# Patient Record
Sex: Female | Born: 1985 | Race: White | Hispanic: No | Marital: Married | Smoking: Never smoker
Health system: Southern US, Academic
[De-identification: ages and names within clinical notes are randomized; demographics above are authoritative.]

## PROBLEM LIST (undated history)

## (undated) DIAGNOSIS — R001 Bradycardia, unspecified: Secondary | ICD-10-CM

## (undated) DIAGNOSIS — G43909 Migraine, unspecified, not intractable, without status migrainosus: Secondary | ICD-10-CM

## (undated) DIAGNOSIS — F419 Anxiety disorder, unspecified: Secondary | ICD-10-CM

## (undated) DIAGNOSIS — N809 Endometriosis, unspecified: Secondary | ICD-10-CM

## (undated) HISTORY — DX: Bradycardia, unspecified: R00.1

## (undated) HISTORY — DX: Endometriosis, unspecified: N80.9

## (undated) HISTORY — PX: HX BREAST REDUCTION: SHX8

## (undated) HISTORY — PX: HX DILATION AND CURETTAGE: SHX78

## (undated) HISTORY — PX: HX BREAST BIOPSY: SHX20

## (undated) HISTORY — PX: ABDOMINAL SURGERY: SHX537

## (undated) HISTORY — DX: Migraine, unspecified, not intractable, without status migrainosus: G43.909

## (undated) HISTORY — PX: HX BREAST RECONSTRUCTION: SHX9

## (undated) HISTORY — DX: Anxiety disorder, unspecified: F41.9

## (undated) HISTORY — PX: HX BREAST AUGMENTATION: SHX7

---
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
## 2013-04-22 ENCOUNTER — Ambulatory Visit (INDEPENDENT_AMBULATORY_CARE_PROVIDER_SITE_OTHER): Payer: Self-pay | Admitting: WVUPC-REPRODUCTIVE ENDO

## 2020-12-22 IMAGING — CR XRAY SACRUM/COCCYX MIN 2 VIEWS
1 series · 3 of 3 positions shown · non-contrast
Comparison: Lumbosacral spine x-ray of 03/03/2020.

﻿EXAM:  04445      XRAY SACRUM/COCCYX MIN 2 VIEWS
INDICATION: Trauma due to fall.

[Series 1: view not recorded · 0.17mm/px · 3 of 3 slices shown]
[im 1/3]
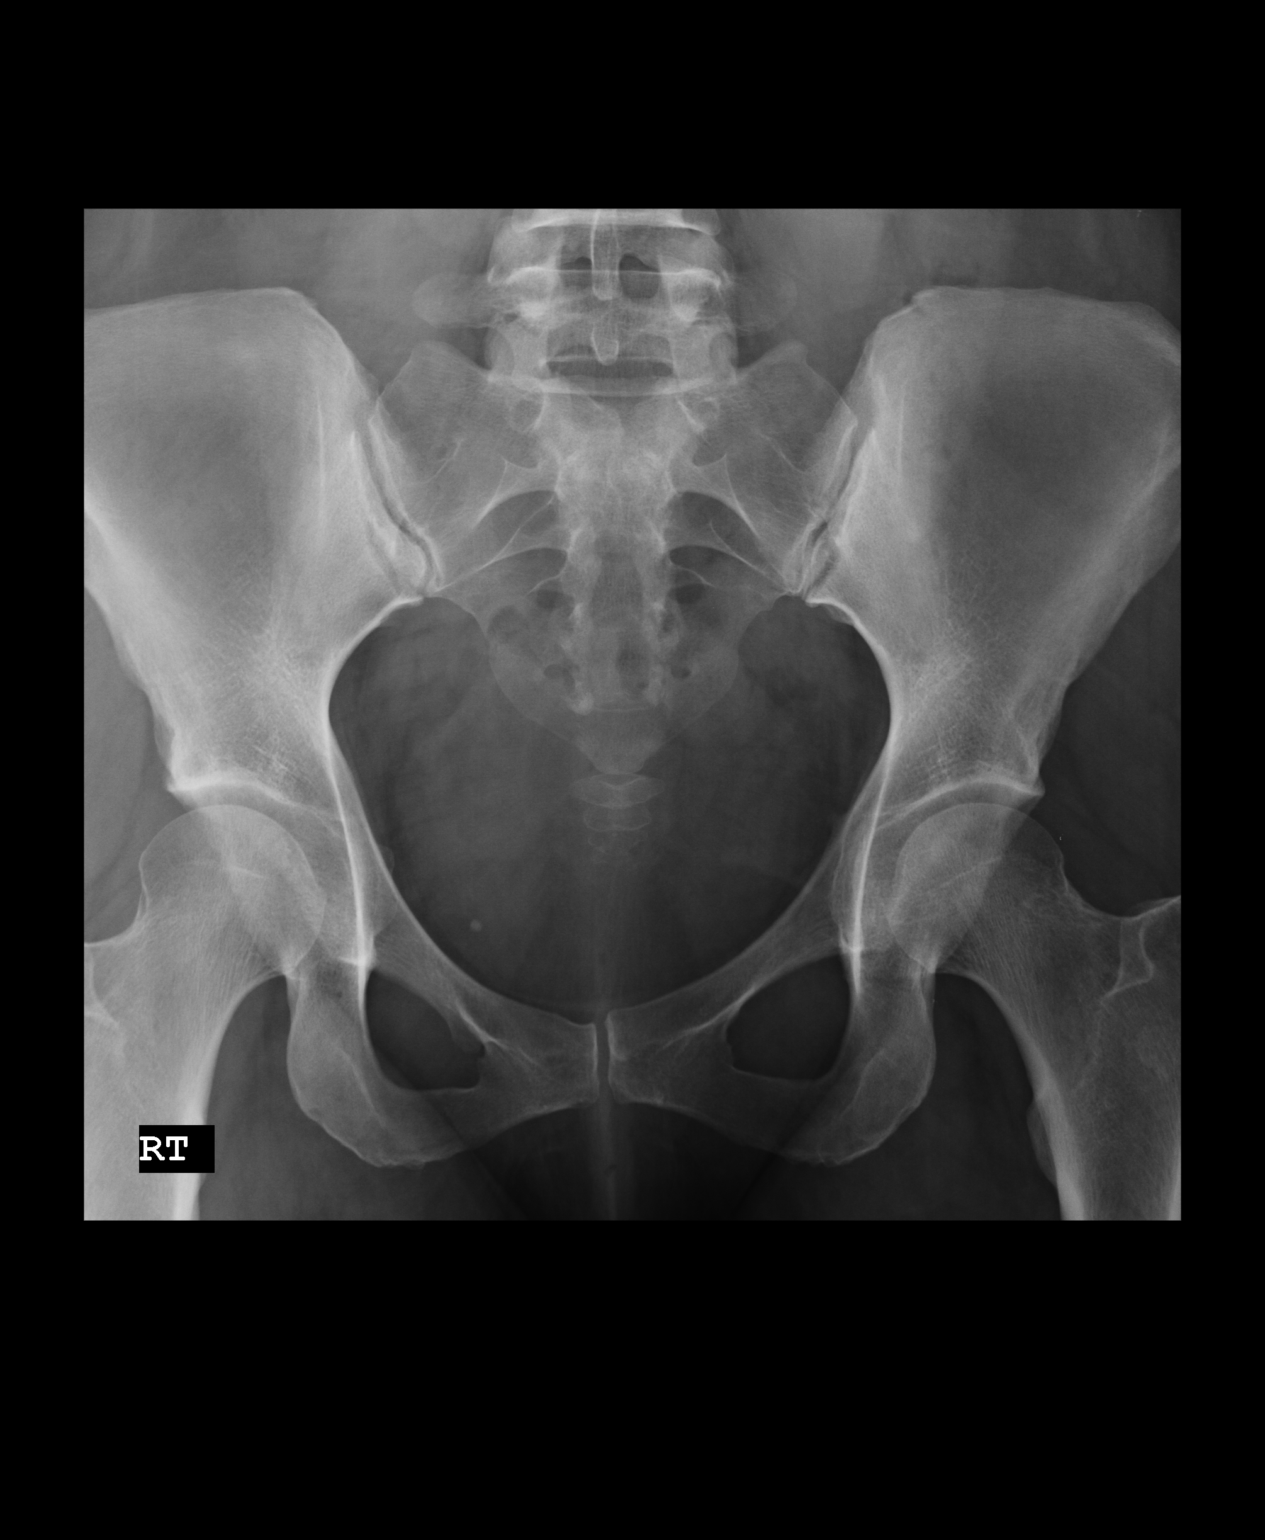
[im 2/3]
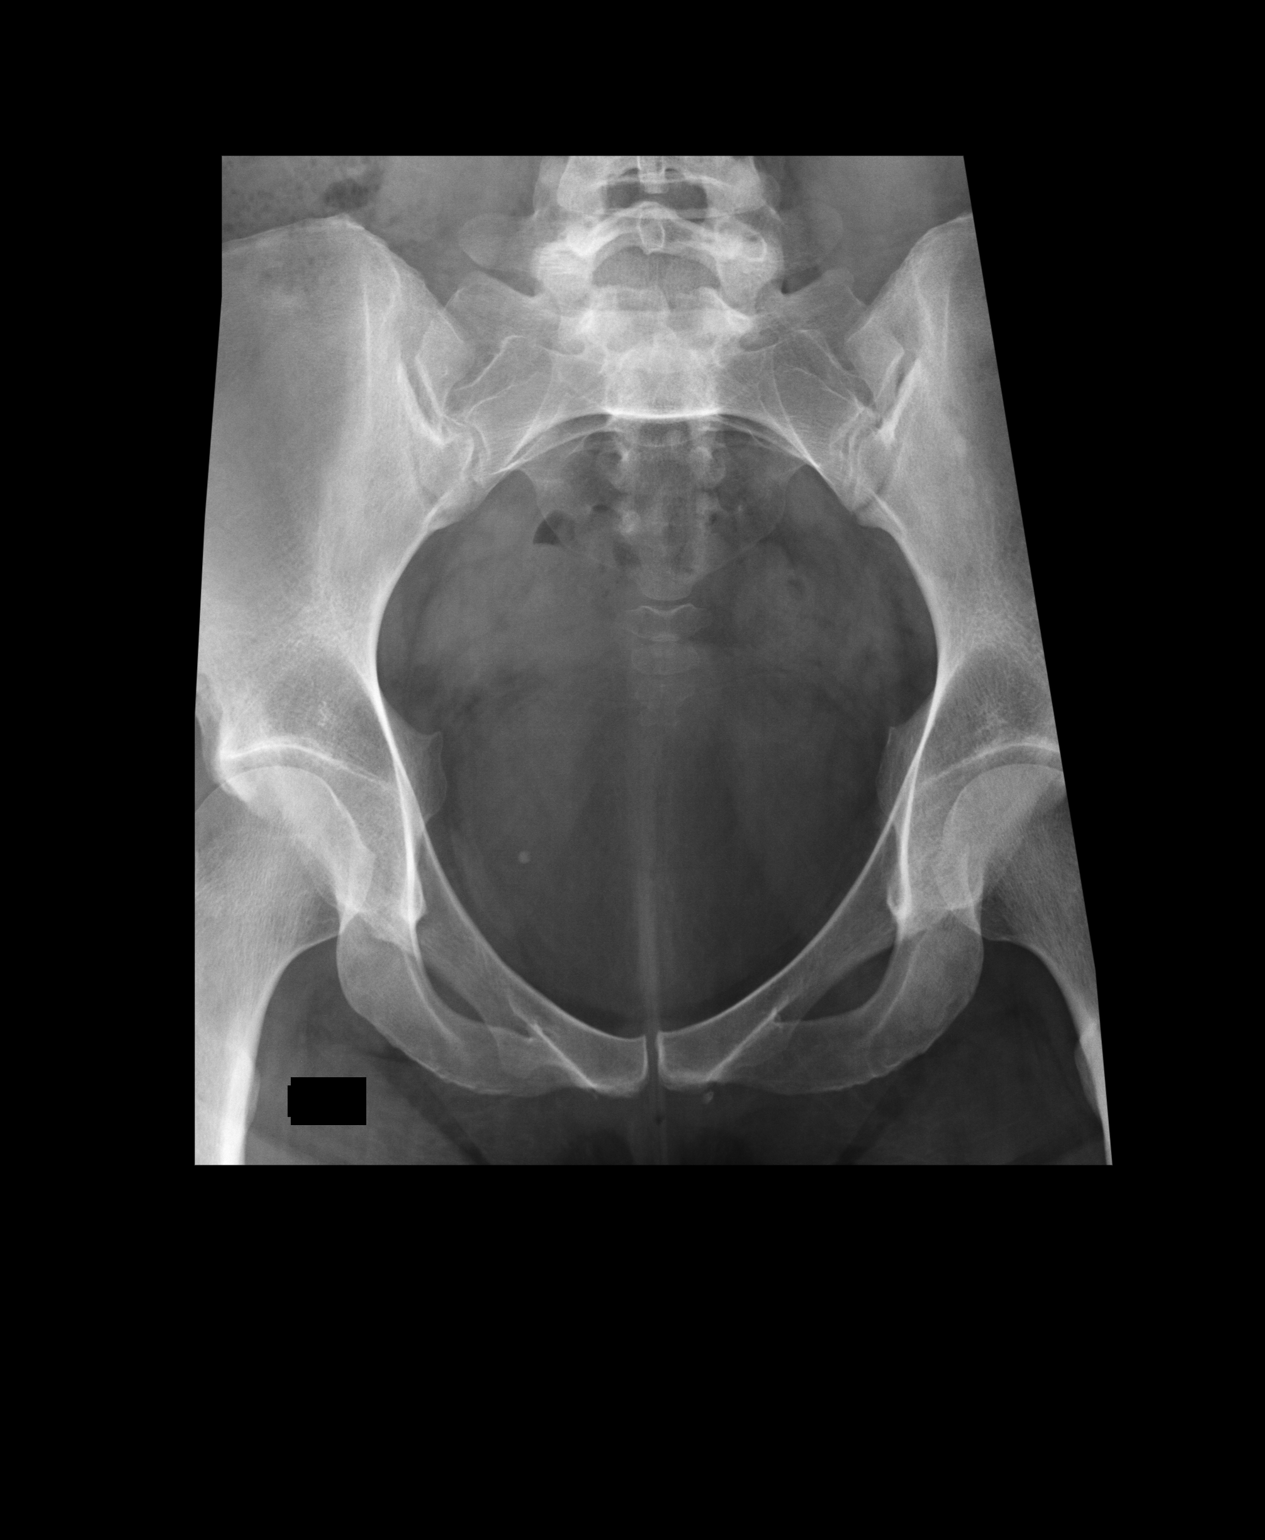
[im 3/3]
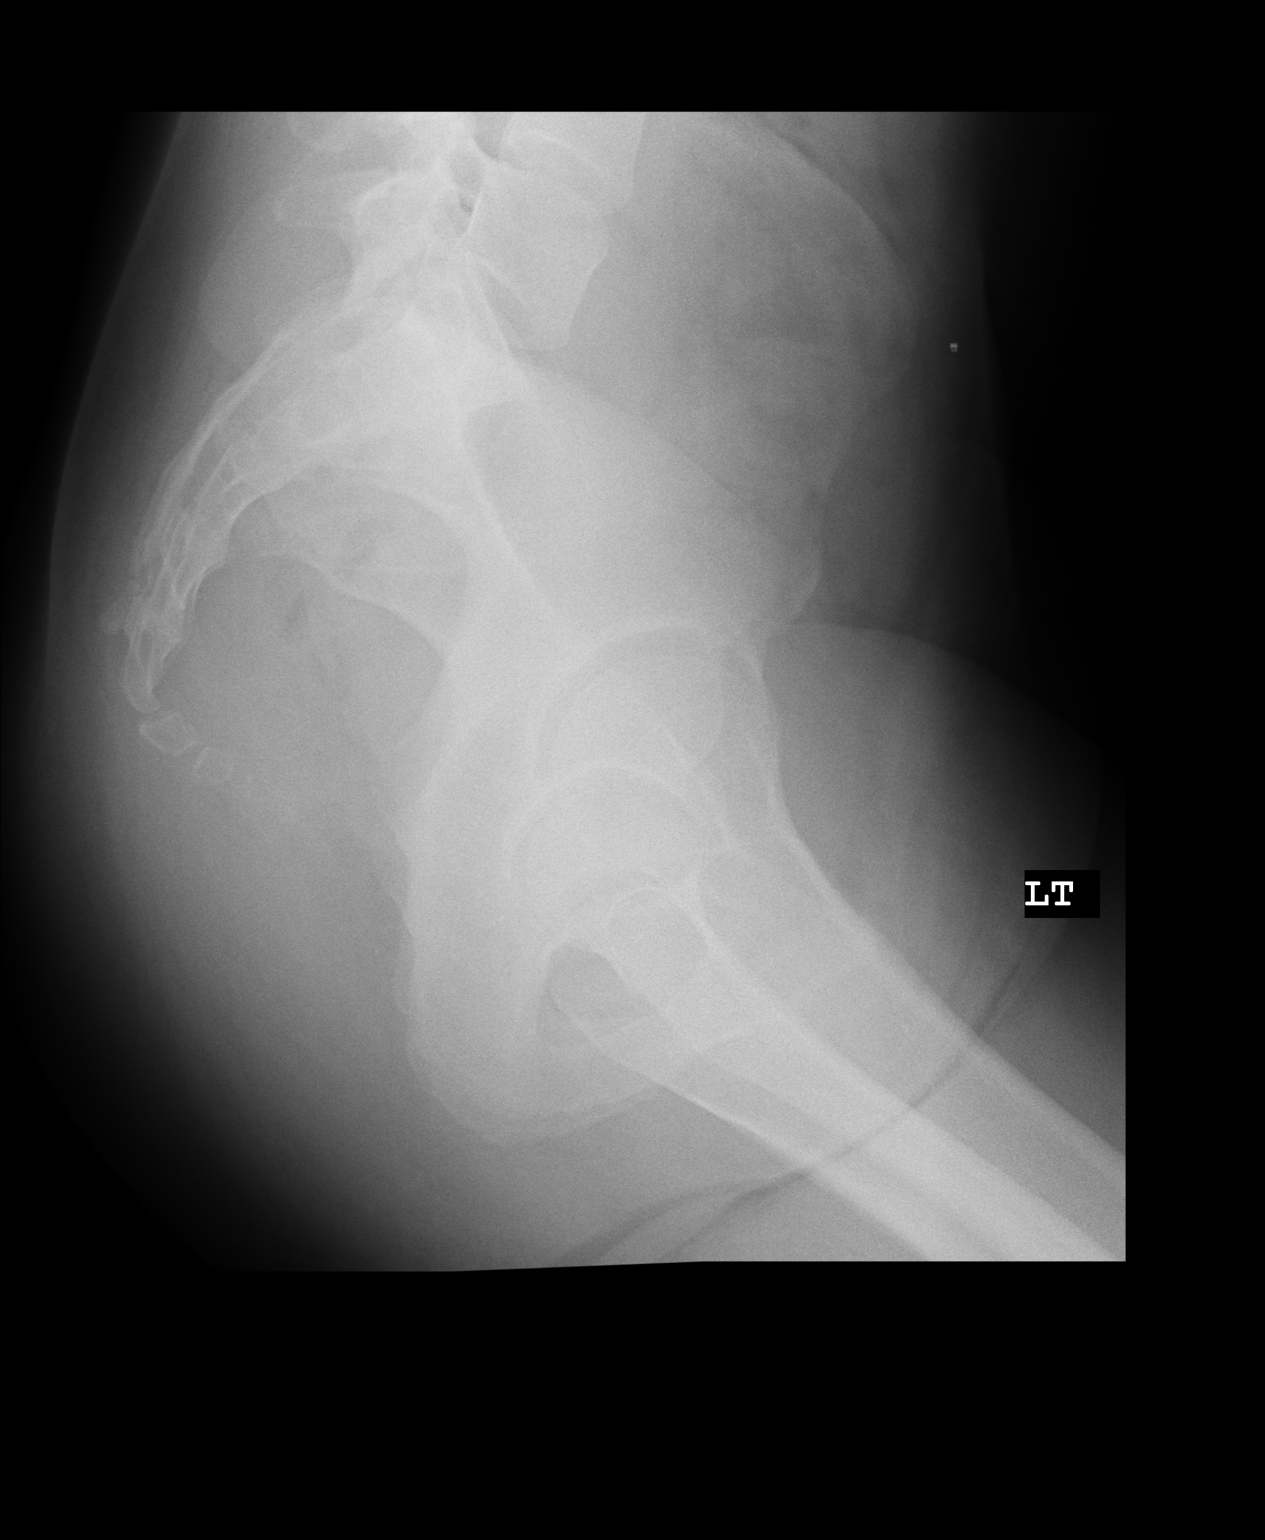

[3 of 3 positions shown; findings below may reference images not displayed]

FINDINGS: No acute bony lesions of sacrum and coccyx are seen.  Soft tissues are unremarkable.
IMPRESSION: No acute bony lesions are seen.  If symptoms are localized and persistent, further evaluation by CT or MRI can be considered.

## 2021-03-15 ENCOUNTER — Ambulatory Visit (HOSPITAL_COMMUNITY)
Admission: RE | Admit: 2021-03-15 | Discharge: 2021-03-15 | Disposition: A | Discharge status: Home / Self Care | Payer: Self-pay | Source: Ambulatory Visit

## 2021-03-15 IMAGING — MG 3D DX MAMMO BIL W/CAD & TOMO
5 series · 10 of 16 positions shown · non-contrast
Comparison: None.  This is a baseline exam.

------------- REPORT [id number] -------------
﻿

SETYANI, WAIKIU
3D DX MAMMO BIL W/CAD & TOMO,US RT BREAST COMPLETE- 4 QUADRANTS PLUS AXILLA
EXAM:  3D BILATERAL DIAGNOSTIC DIGITAL MAMMOGRAM WITH CAD AND TOMOSYNTHESIS, COMPLETE RIGHT BREAST ULTRASOUND
INDICATION: Palpable lump medial right breast.  History of fibroadenoma.

[L]
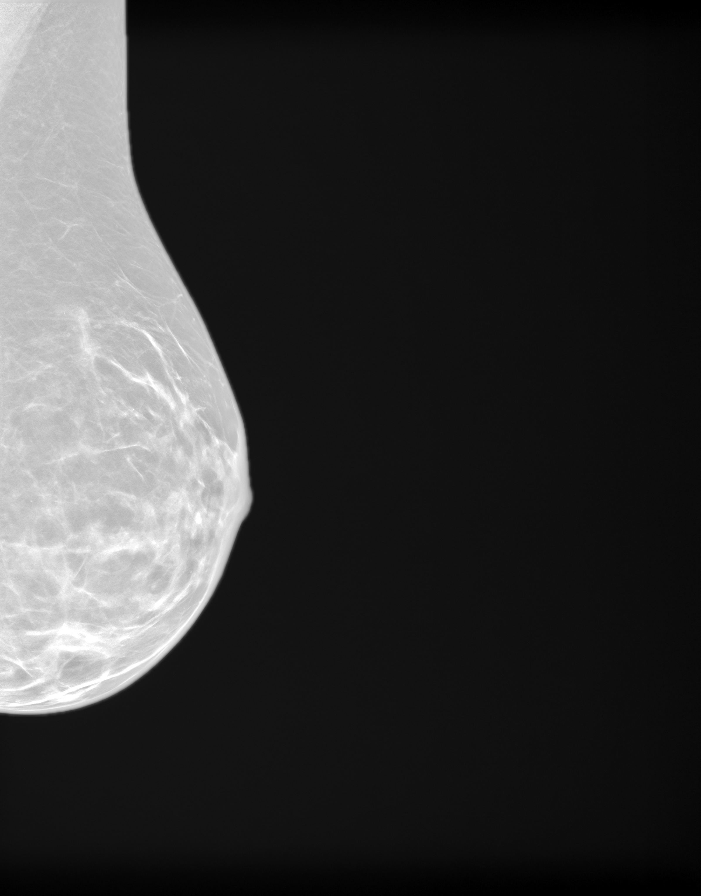

[R]
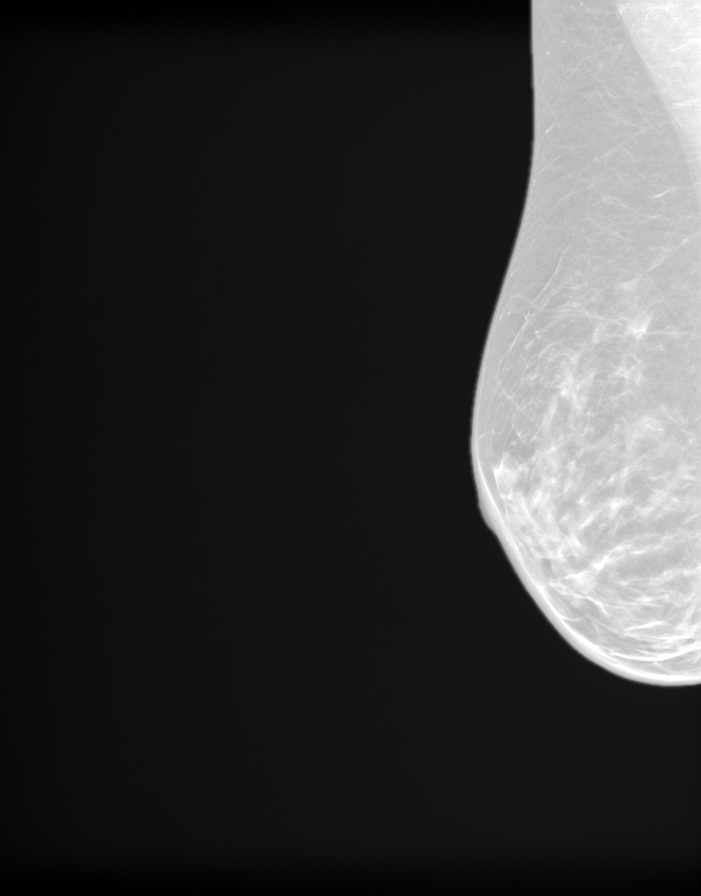

[R CC tomo · right · 0.10mm/px · 4 of 6 slices shown]
[im 1/6]
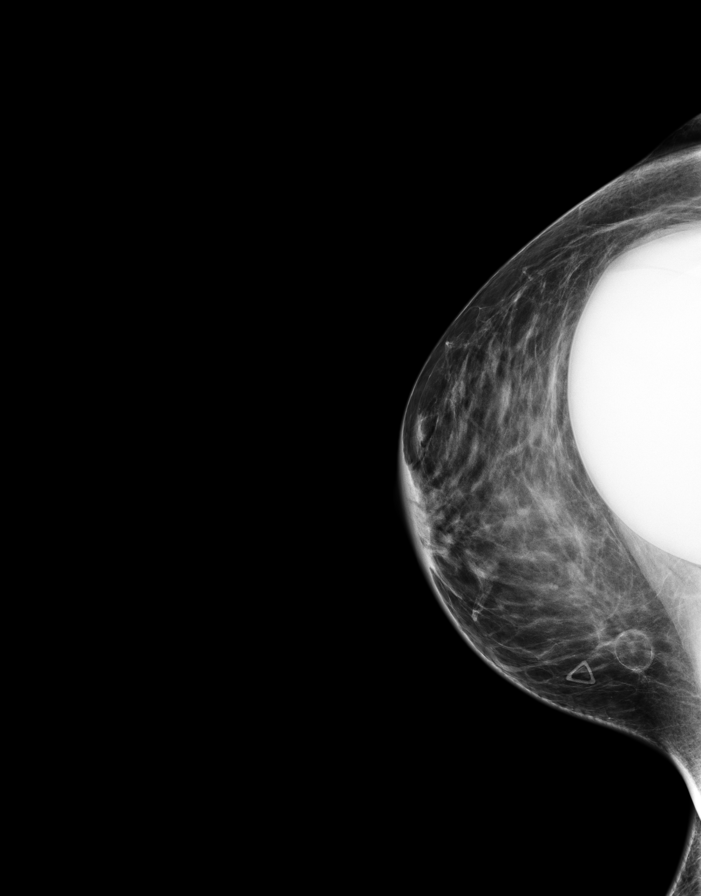
[im 2/6]
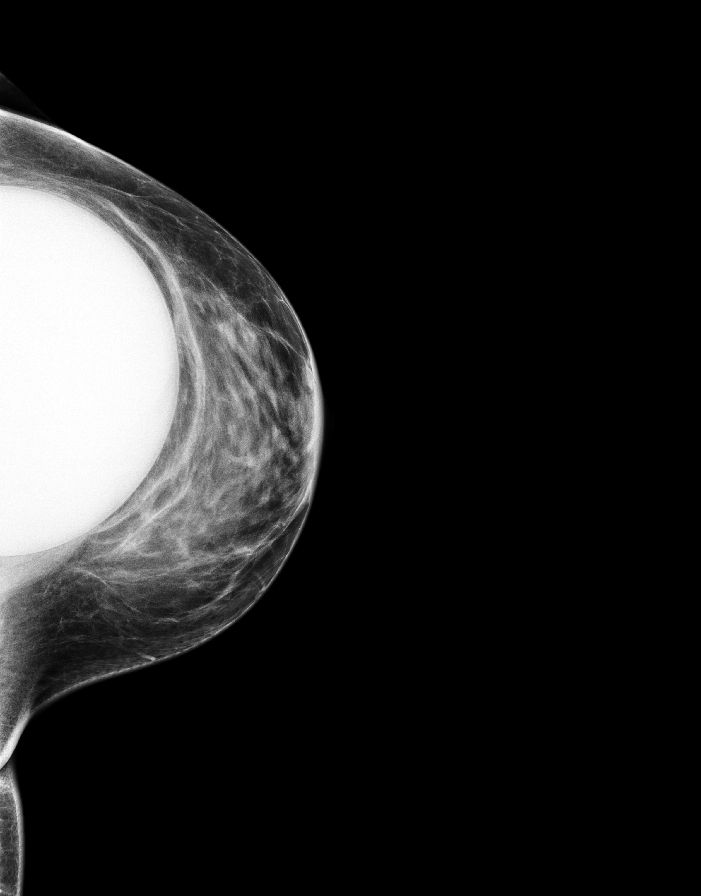
[im 4/6]
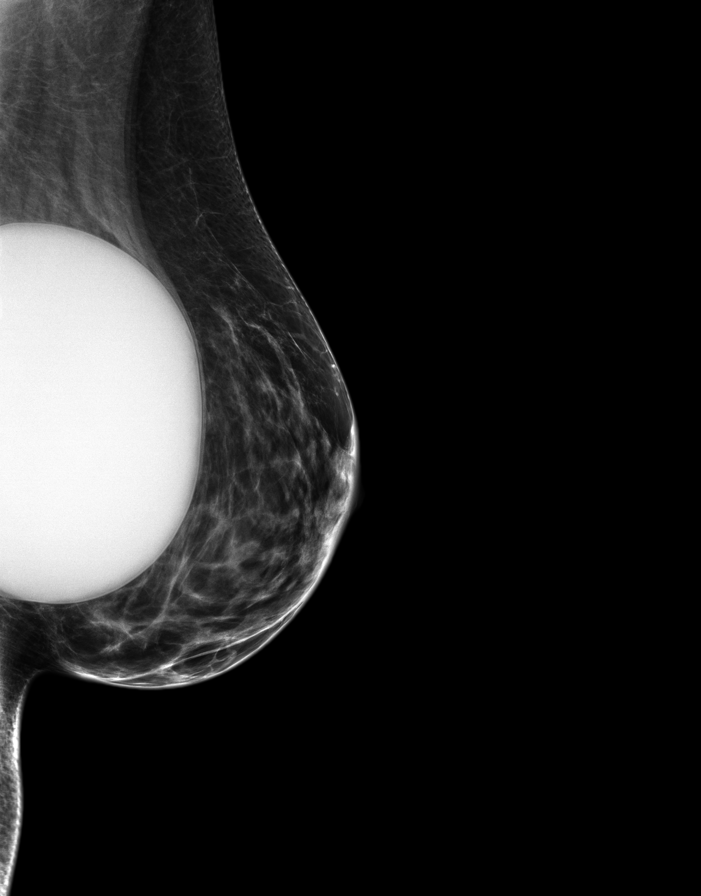
[im 6/6]
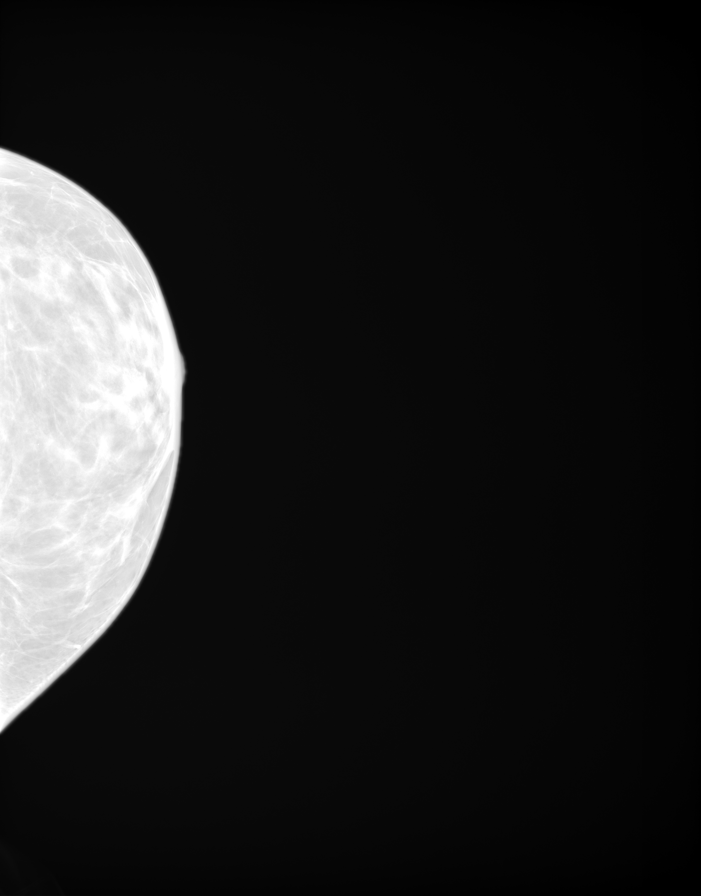

[3D DX MAMMO BIL W/CAD & TOMO tomo · 2 acquisitions, 3 frames shown (1 of 2)]
[im 1/2]
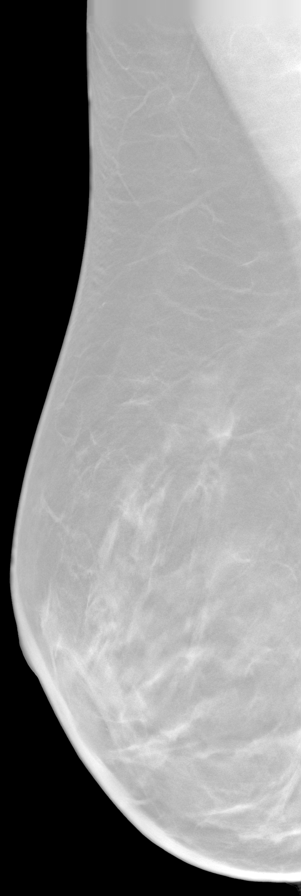
[im 1/2]
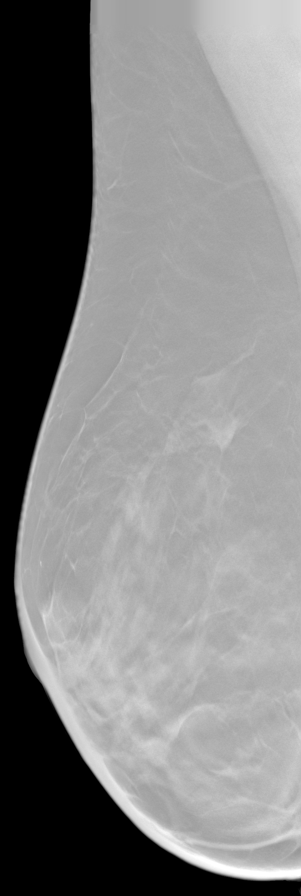
[im 1/2]
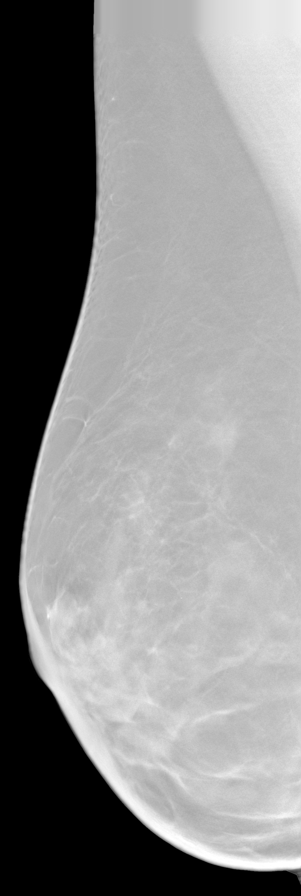

[3D DX MAMMO BIL W/CAD & TOMO tomo (2 of 2) · tomo slice 7/12.0]
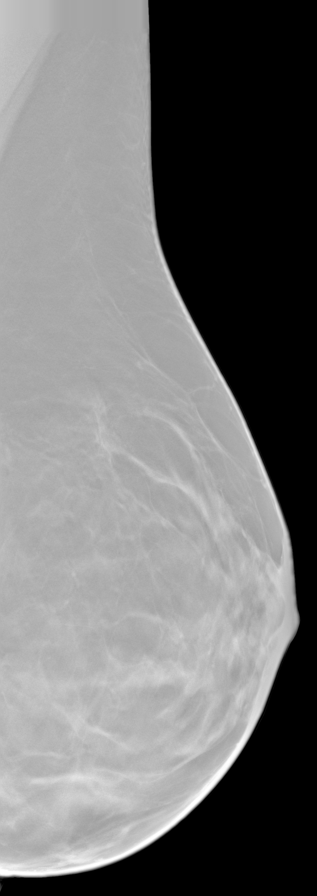

[10 of 16 positions shown; findings below may reference images not displayed]

FINDINGS: There are scattered fibroglandular elements.  There is no mass or suspicious cluster of microcalcifications.   There is a 13.5 mm benign appearing, well-circumscribed rim calcification within the medial right breast corresponding to patient’s palpable lump.  This is suggestive of an oil cyst.  Bilateral subpectoral breast implants are also noted.  There is no architectural distortion, skin thickening or nipple retraction. 

A comprehensive sonographic evaluation of the right breast was also performed and representative images of all four quadrants, retroareolar and axillary regions were obtained.  There is a 14.5 mm complex cyst with marginal calcifications at 4 o'clock position.  This corresponds to the mammographic finding and patient’s palpable lump and is most compatible with an oil cyst.  There is no suspicious solid or cystic mass.  Mild ductal dilatation is seen within the retroareolar region.  There is no axillary adenopathy.
IMPRESSION: 1.  BIRADS 2-Benign findings.  Imaging findings most compatible with an oil cyst within the right breast at 4 o'clock position corresponding to patient’s palpable lump.  Continued clinical followup is recommended.

2.  DENSITY CODE – B (Scattered areas of fibroglandular density). 

Final Assessment Code:

Bi-Rads 2 

BI-RADS 0
 Need additional imaging evaluation.

BI-RADS 1
 Negative mammogram.

BI-RADS 2
 Benign finding.

BI-RADS 3
 Probably benign finding; short-interval follow-up suggested.

BI-RADS 4
 Suspicious abnormality; biopsy should be considered.

BI-RADS 5
 Highly suggestive of malignancy; appropriate action should be taken.

SETYANI, WAIKIU

3D DX MAMMO BIL W/CAD & TOMO,US RT BREAST COMPLETE- 4 QUADRANTS PLUS AXILLA

BI-RADS 6
 Known biopsy-proven malignancy; appropriate action should be taken.

NOTE:
In compliance with Federal regulations, the results of this mammogram are being sent to the patient.

------------- REPORT [id number] -------------
[facility]
[address]
Tiger Ms.CHECCACCI, LIQENI:
We wish to report the following on your recent mammography examination. We are sending a report to your referring physician or other health care provider. 
(       Normal/Negative:
No evidence of cancer.
This statement is mandated by the [place], Department of Health.
Your examination was performed by one of our technologists, who are registered radiological technologists and also specially certified in mammography:
___
Markland, Marjuan (M)

Your mammogram was interpreted by our radiologist.

( 
Collette Sedman, M.D.

(Annual Breast Examination by a physician or other health care provider
(Annual Mammography Screening beginning at age 40
(Monthly Breast Self Examination

## 2021-03-15 IMAGING — US US RT BREAST COMPLETE- 4 QUADRANTS PLUS AXILLA
1 series · 13 of 25 positions shown · non-contrast
Comparison: None.  This is a baseline exam.

﻿

VIRGO, BINTA
3D DX MAMMO BIL W/CAD & TOMO,US RT BREAST COMPLETE- 4 QUADRANTS PLUS AXILLA
EXAM:  3D BILATERAL DIAGNOSTIC DIGITAL MAMMOGRAM WITH CAD AND TOMOSYNTHESIS, COMPLETE RIGHT BREAST ULTRASOUND
INDICATION: Palpable lump medial right breast.  History of fibroadenoma.

[Series 1: us right breast complete- 4 quadrants plus axilla · 13 of 45 slices shown]
[im 1/45]
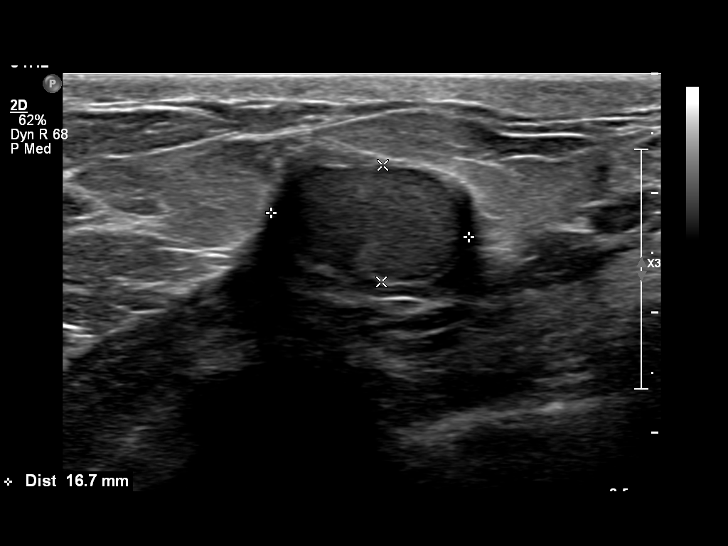
[im 4/45]
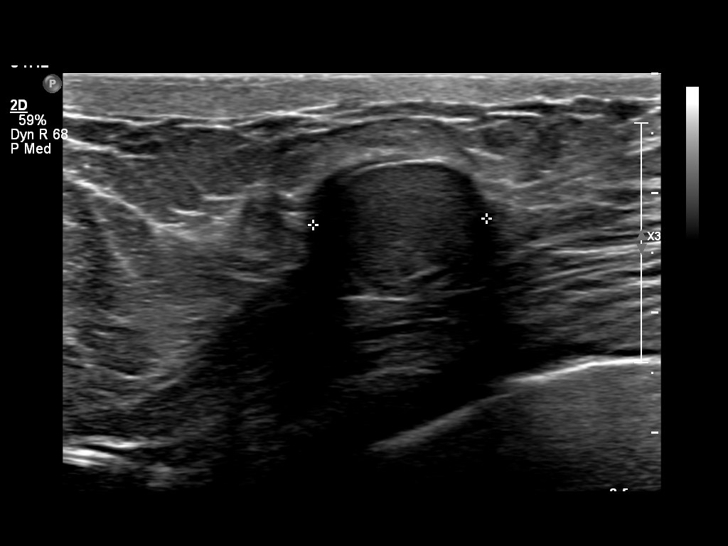
[im 8/45]
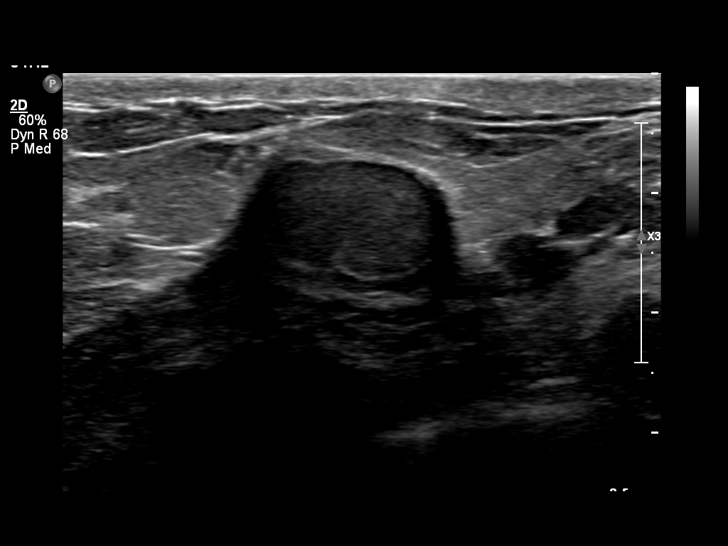
[im 12/45]
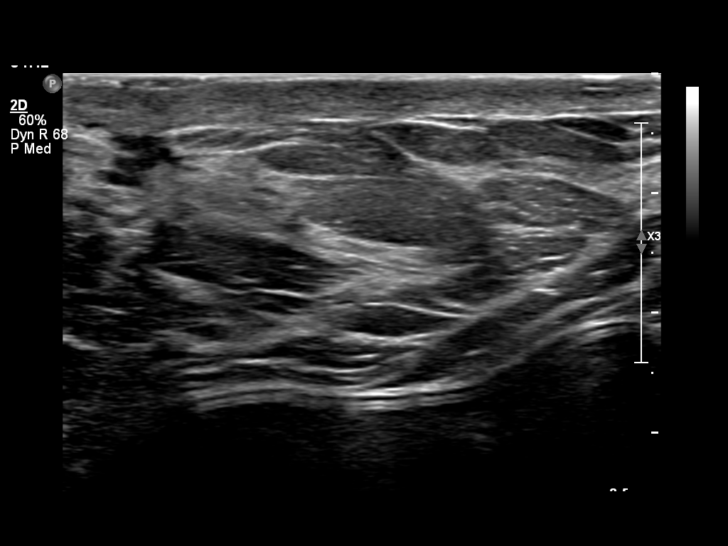
[im 15/45]
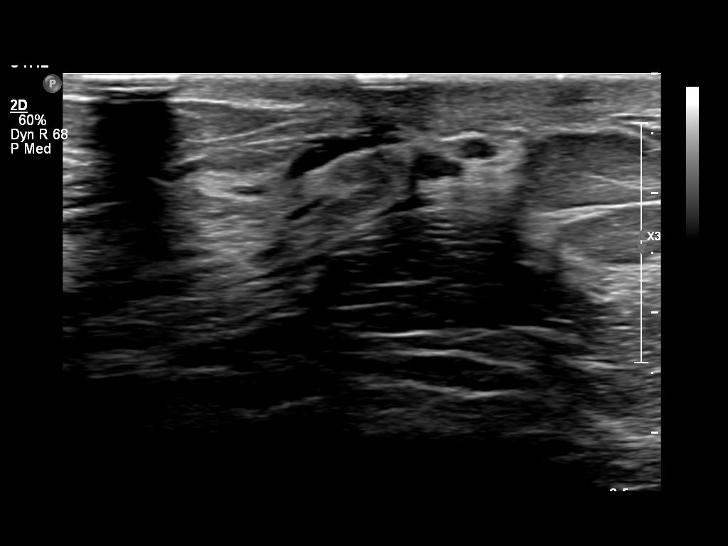
[im 19/45]
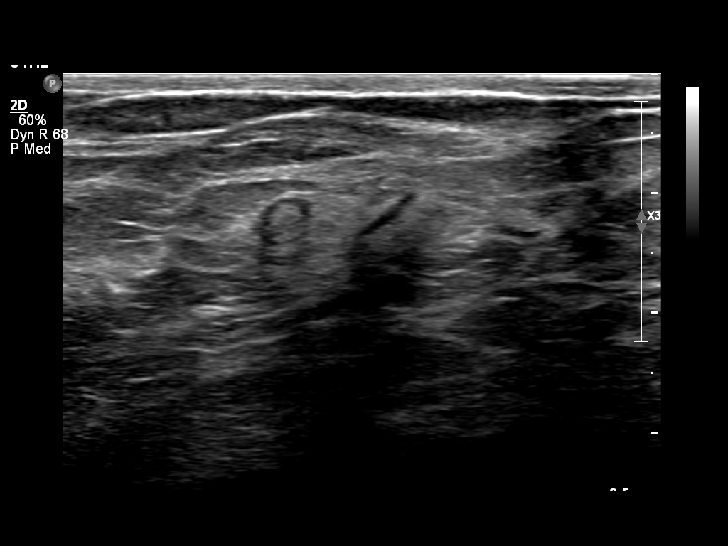
[im 23/45]
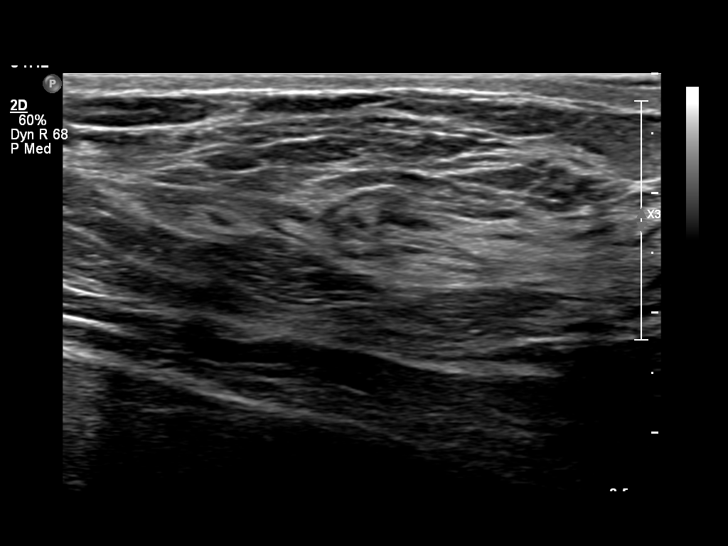
[im 26/45]
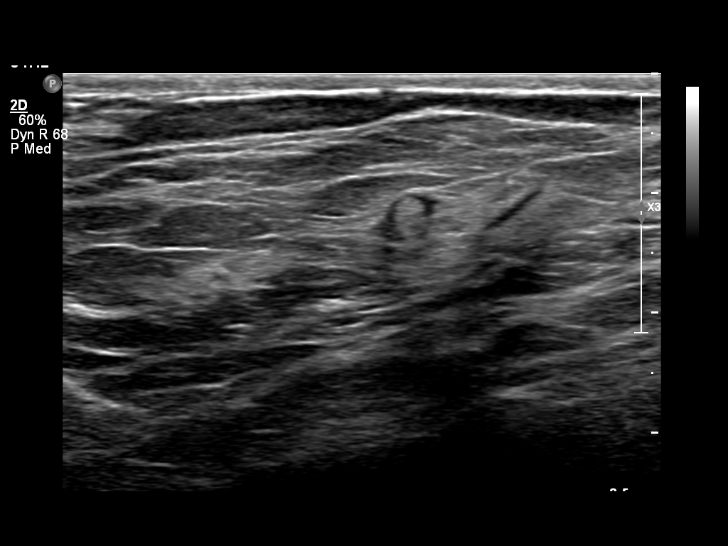
[im 30/45]
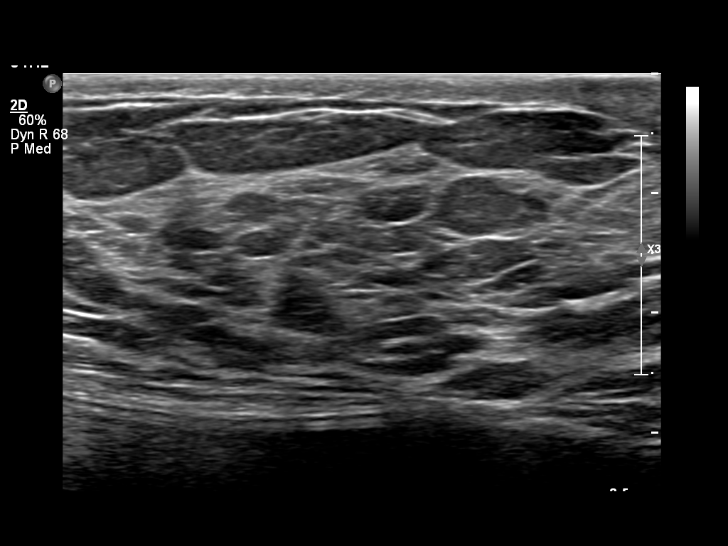
[im 34/45]
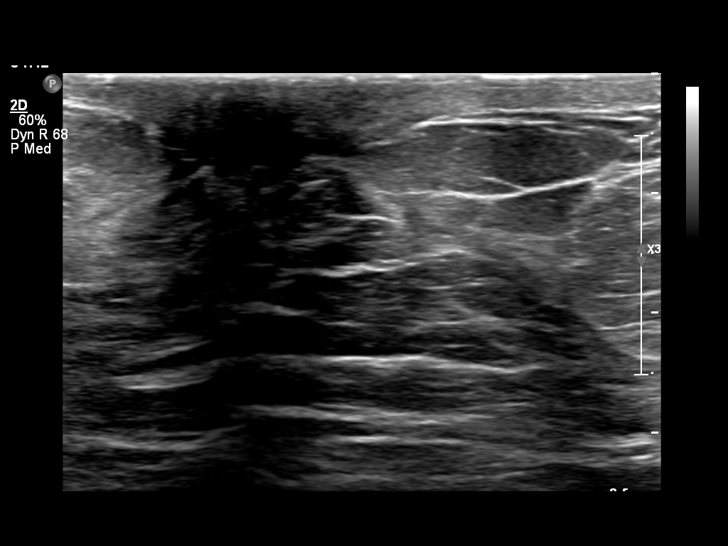
[im 37/45]
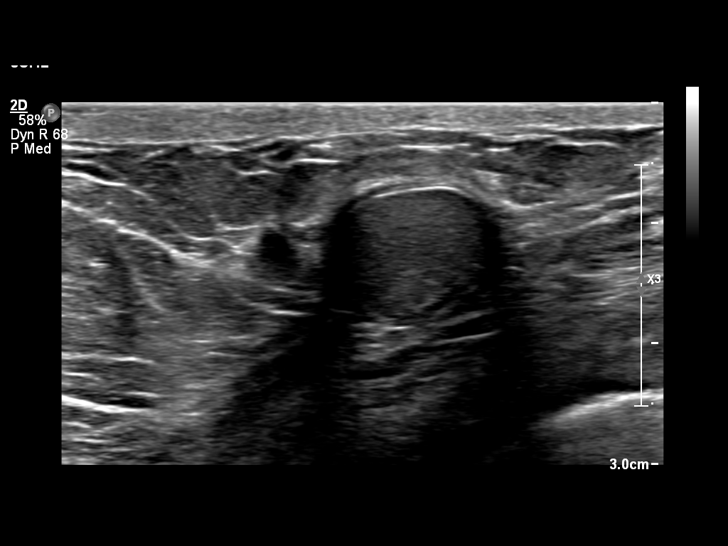
[im 41/45]
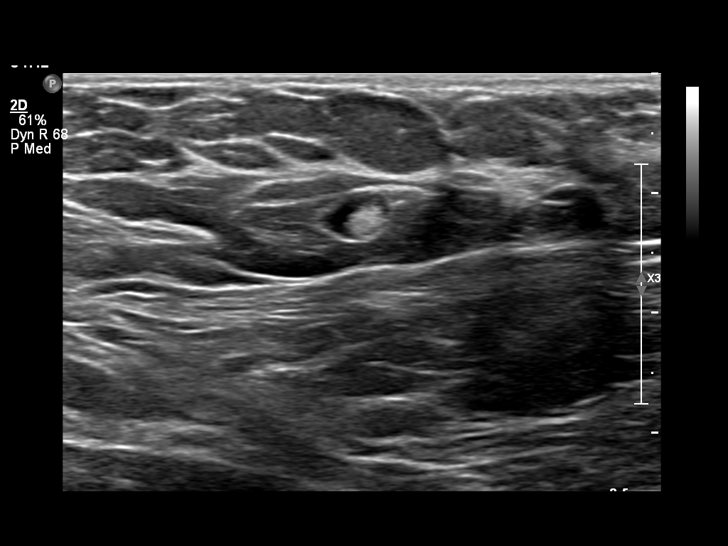
[im 45/45]
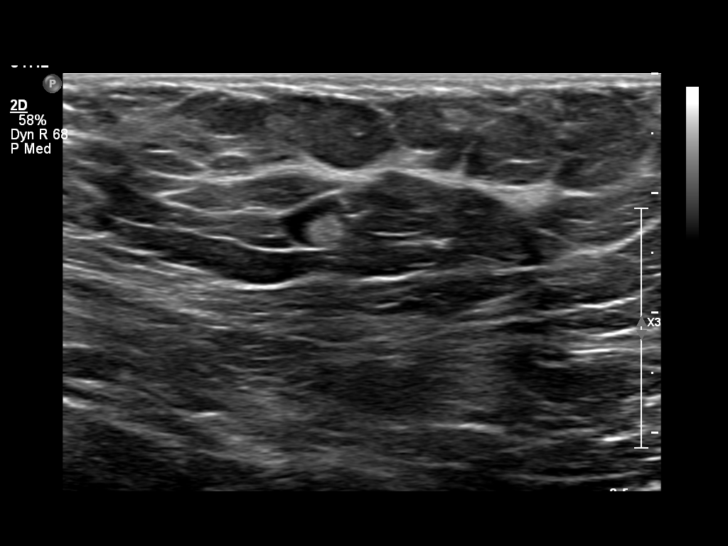

[13 of 25 positions shown; findings below may reference images not displayed]

FINDINGS: There are scattered fibroglandular elements.  There is no mass or suspicious cluster of microcalcifications.   There is a 13.5 mm benign appearing, well-circumscribed rim calcification within the medial right breast corresponding to patient’s palpable lump.  This is suggestive of an oil cyst.  Bilateral subpectoral breast implants are also noted.  There is no architectural distortion, skin thickening or nipple retraction. 

A comprehensive sonographic evaluation of the right breast was also performed and representative images of all four quadrants, retroareolar and axillary regions were obtained.  There is a 14.5 mm complex cyst with marginal calcifications at 4 o'clock position.  This corresponds to the mammographic finding and patient’s palpable lump and is most compatible with an oil cyst.  There is no suspicious solid or cystic mass.  Mild ductal dilatation is seen within the retroareolar region.  There is no axillary adenopathy.
IMPRESSION: 1.  BIRADS 2-Benign findings.  Imaging findings most compatible with an oil cyst within the right breast at 4 o'clock position corresponding to patient’s palpable lump.  Continued clinical followup is recommended.

2.  DENSITY CODE – B (Scattered areas of fibroglandular density). 

Final Assessment Code:

Bi-Rads 2 

BI-RADS 0
 Need additional imaging evaluation.

BI-RADS 1
 Negative mammogram.

BI-RADS 2
 Benign finding.

BI-RADS 3
 Probably benign finding; short-interval follow-up suggested.

BI-RADS 4
 Suspicious abnormality; biopsy should be considered.

BI-RADS 5
 Highly suggestive of malignancy; appropriate action should be taken.

VIRGO, BINTA

3D DX MAMMO BIL W/CAD & TOMO,US RT BREAST COMPLETE- 4 QUADRANTS PLUS AXILLA

BI-RADS 6
 Known biopsy-proven malignancy; appropriate action should be taken.

NOTE:
In compliance with Federal regulations, the results of this mammogram are being sent to the patient.

## 2021-03-15 IMAGING — MR MRI LUMBAR SPINE WITHOUT CONTRAST
5 of 6 series · 32 of 48 positions shown · IV contrast (gadolinium)
Comparison: Radiographs dated 03/03/2020 and CT abdomen pelvis dated 04/21/2015.

﻿EXAM:  [id number]   MRI LUMBAR SPINE WITHOUT CONTRAST
INDICATION: Lower back and right groin pain.
TECHNIQUE: Multiplanar multisequential MRI of the lumbar spine was performed without gadolinium contrast.

[Series 5: T2 · sagittal · 4.0mm · 0.94mm/px · 6 of 13 slices shown (1 of 3)]
[im 1/13]
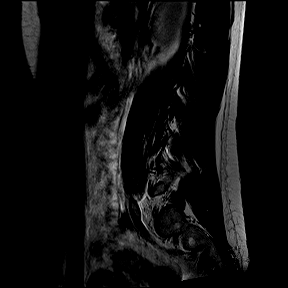
[im 3/13]
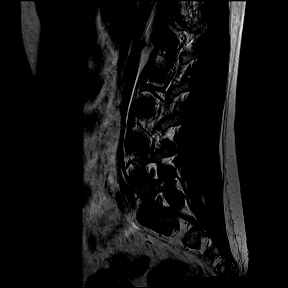
[im 5/13]
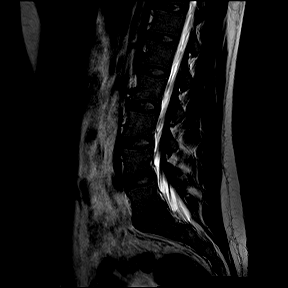
[im 8/13]
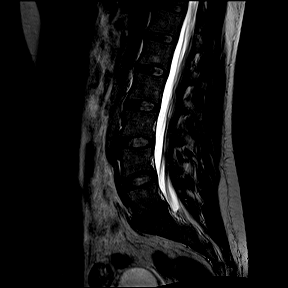
[im 10/13]
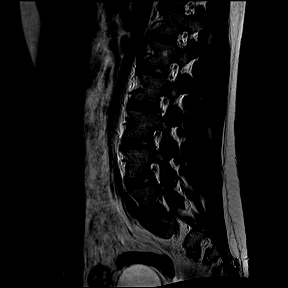
[im 13/13]
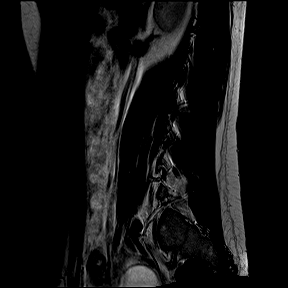

[Series 6: T1 · sagittal · 4.0mm · 0.94mm/px · 6 of 13 slices shown (1 of 2)]
[im 1/13]
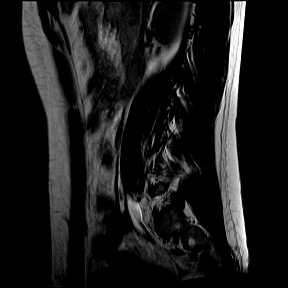
[im 3/13]
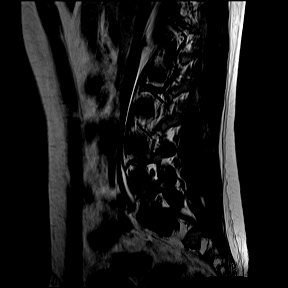
[im 5/13]
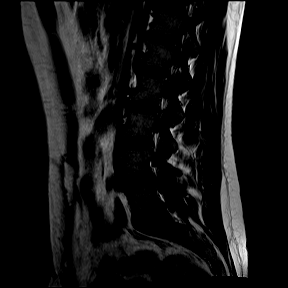
[im 8/13]
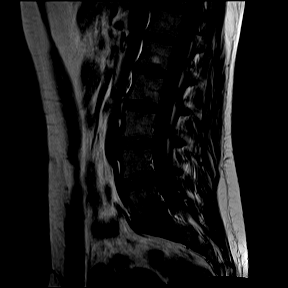
[im 10/13]
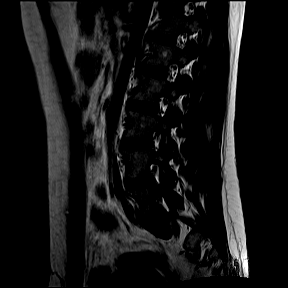
[im 13/13]
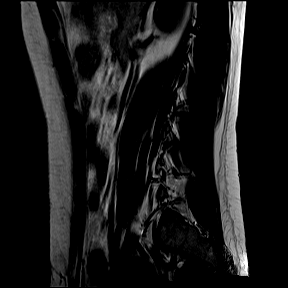

[Series 8: T2 · coronal · 5.0mm · 0.82mm/px · 8 of 18 slices shown (2 of 3)]
[im 1/18]
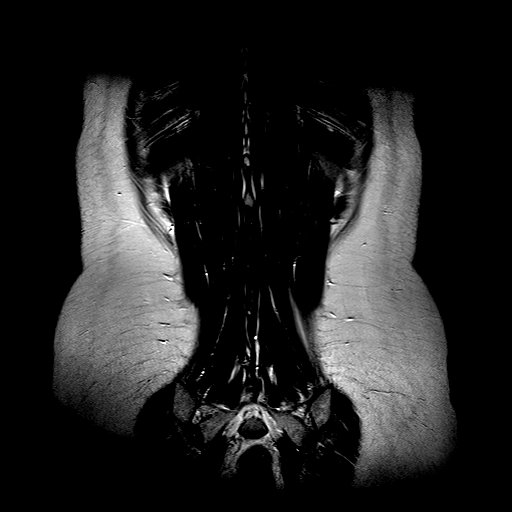
[im 3/18]
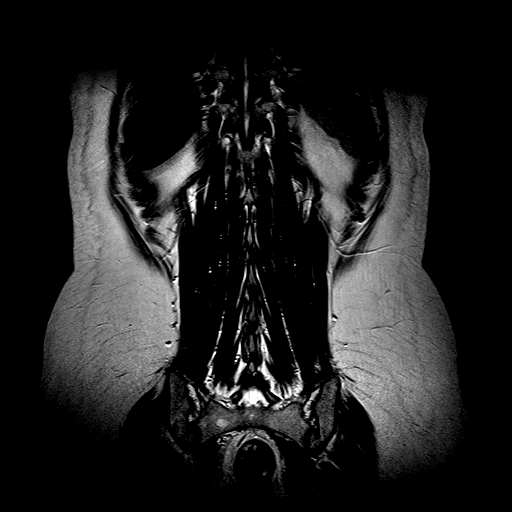
[im 5/18]
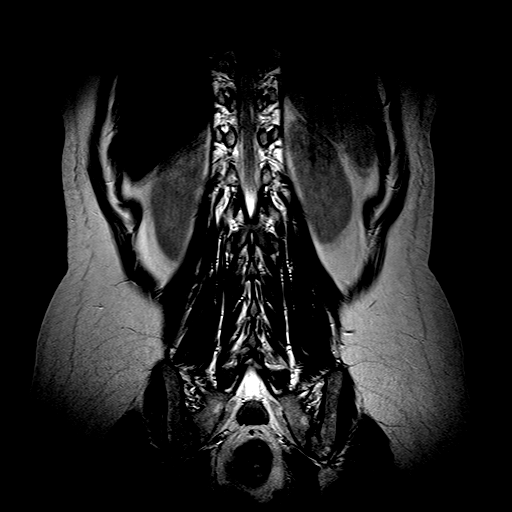
[im 8/18]
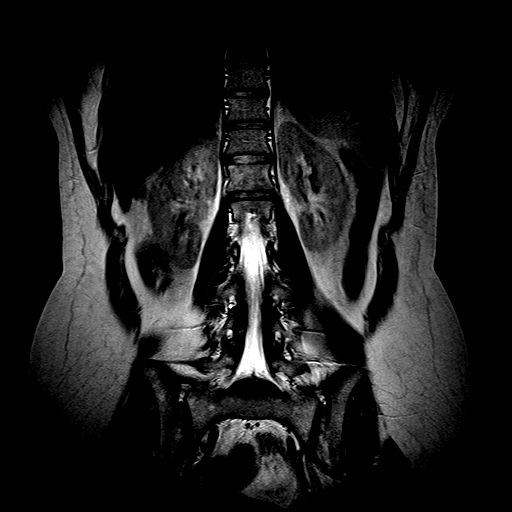
[im 10/18]
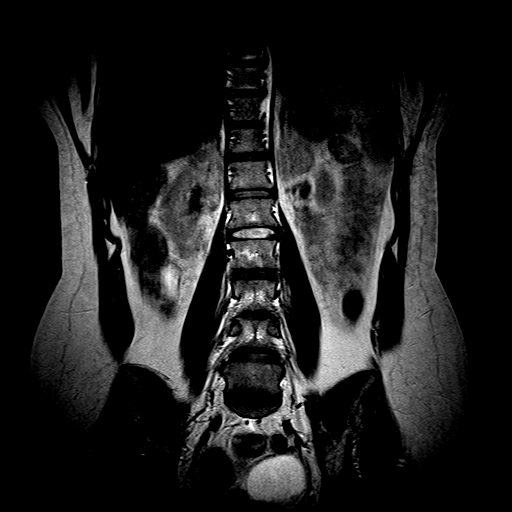
[im 13/18]
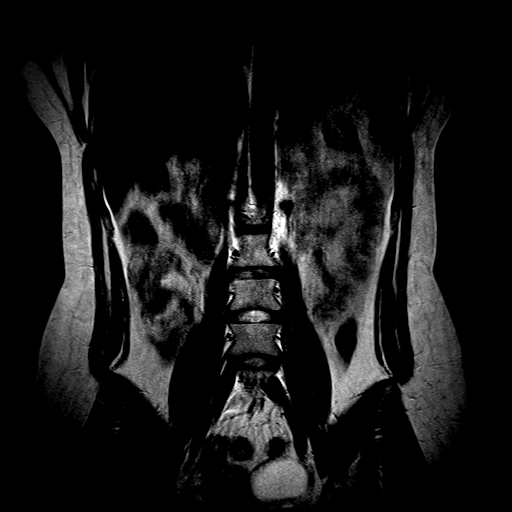
[im 15/18]
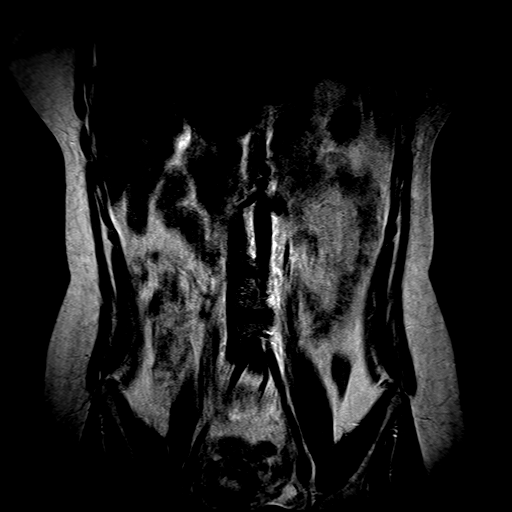
[im 18/18]
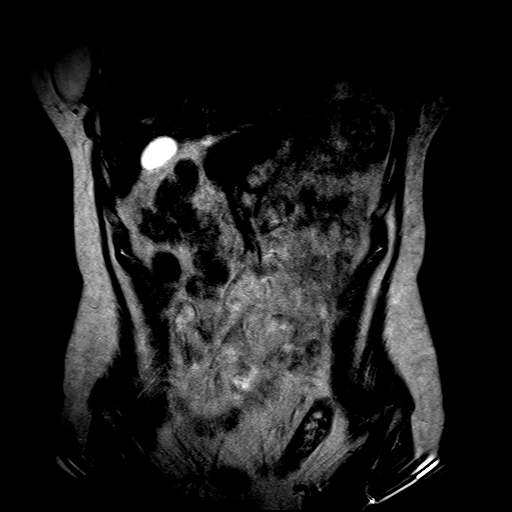

[Series 9: T2 · axial · 4.0mm · 0.52mm/px · z∈[-170,+40]mm · 11 of 23 slices shown (3 of 3)]
[im 1/23]
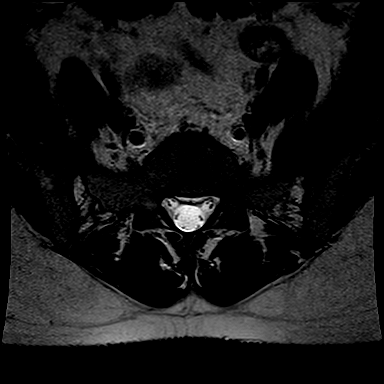
[im 3/23]
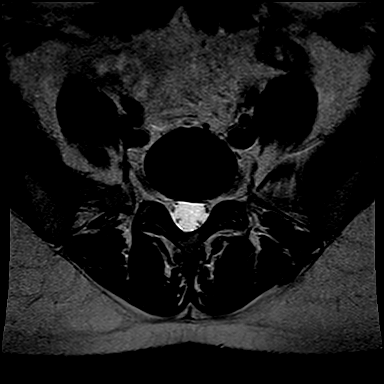
[im 5/23]
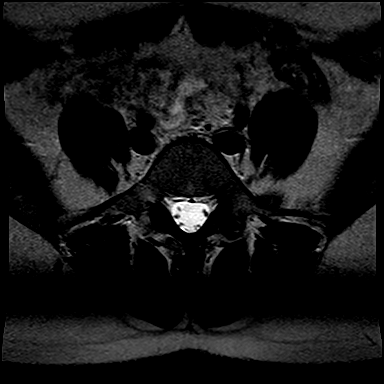
[im 7/23]
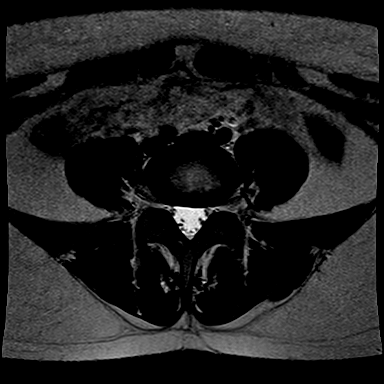
[im 9/23]
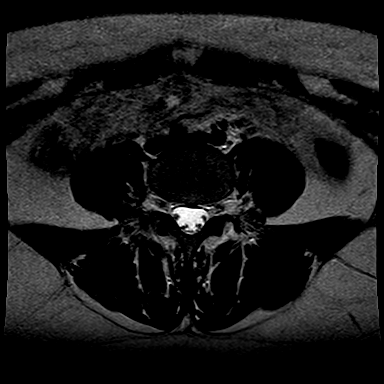
[im 12/23]
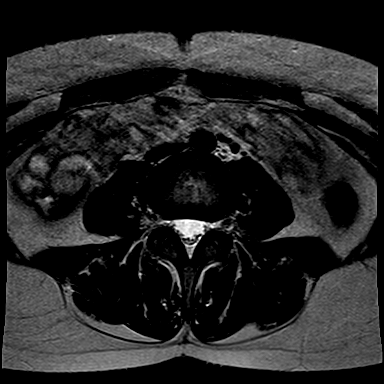
[im 14/23]
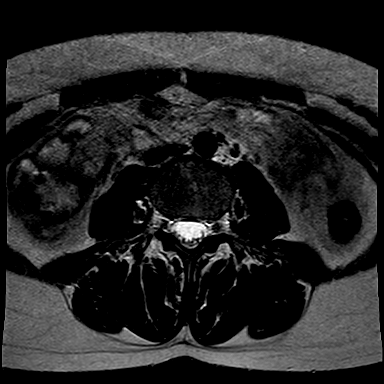
[im 16/23]
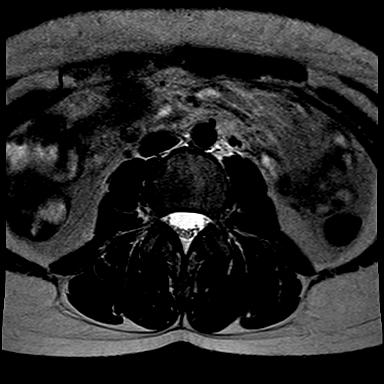
[im 18/23]
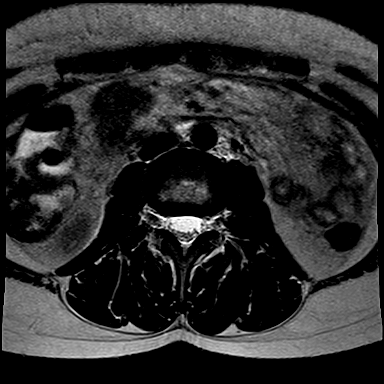
[im 20/23]
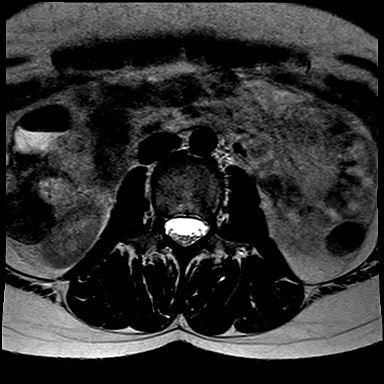
[im 23/23]
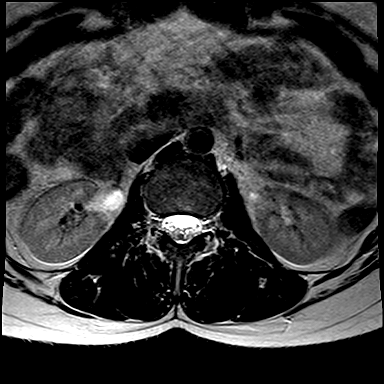

[Series 10: T1 · axial · 4.0mm · 0.52mm/px · 1 of 23 slices shown (2 of 2)]
[im 1/23]
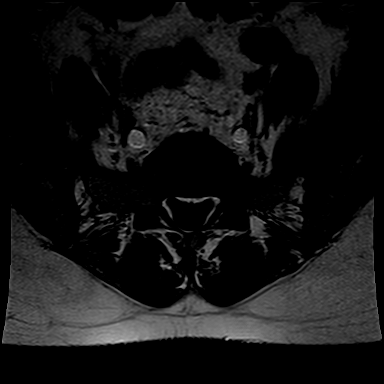

[32 of 48 positions shown; findings below may reference images not displayed]

FINDINGS: Vertebral bodies are normal in height, alignment and signal intensity. There is no acute fracture or subluxation. Distal spinal cord is normal in signal intensity and terminates normally at L1 vertebral body level.

L1-2, L2-3 and L3-4 levels are unremarkable.

At L4-5 level, there is a minimal bulging annulus, minimally effacing the ventral thecal sac. There is no significant neural foraminal stenosis.

At L5-S1 level, there is a small broad-based central disc bulge, minimally abutting the ventral thecal sac. There is no significant neural foraminal stenosis.

Paraspinal soft tissues are unremarkable. A 5 cm left adnexal cyst is incidentally identified.
IMPRESSION: 1. Minimal degenerative changes of the lumbar spine as detailed above. 

2. Incidentally noted 5 cm left adnexal cyst. Follow-up pelvic sonogram is recommended in 3 months for reassessment.

## 2022-06-20 ENCOUNTER — Encounter (INDEPENDENT_AMBULATORY_CARE_PROVIDER_SITE_OTHER): Payer: Self-pay | Admitting: Surgery

## 2022-06-21 ENCOUNTER — Other Ambulatory Visit: Payer: Self-pay

## 2022-06-21 ENCOUNTER — Encounter (INDEPENDENT_AMBULATORY_CARE_PROVIDER_SITE_OTHER): Payer: Self-pay | Admitting: Surgery

## 2022-06-21 ENCOUNTER — Ambulatory Visit (INDEPENDENT_AMBULATORY_CARE_PROVIDER_SITE_OTHER): Payer: BC Managed Care – PPO | Admitting: Surgery

## 2022-06-21 VITALS — BP 106/72 | HR 81 | Temp 97.0°F | Ht 66.0 in | Wt 152.0 lb

## 2022-06-21 DIAGNOSIS — K625 Hemorrhage of anus and rectum: Secondary | ICD-10-CM

## 2022-06-29 ENCOUNTER — Encounter (INDEPENDENT_AMBULATORY_CARE_PROVIDER_SITE_OTHER): Payer: Self-pay | Admitting: Surgery

## 2022-06-29 NOTE — Progress Notes (Signed)
Office visit      Reason for Visit: Rectal Bleeding    History of Present Illness  Nicole Gaines presents  for evaluation of rectal bleeding.  Describes acute onset of significant rectal bleeding which was overall without obvious etiology.  Symptoms persisted over about 3 weeks.  Currently resolved.  Previous colonoscopy was in 2016 secondary to family history of colon cancer revealing grade 2 hemorrhoids and redundant colon.  Last colonoscopy was about 4 years ago by description.  No rectal bleeding currently.  No anorectal pain.  No change in bowel habits      I have reviewed the patient's provided medical records and diagnostic testing including laboratory values, imaging results, documented encounters and providers notes with all pertinent information noted with respect to today's evaluation serving as unique tests and sources as a component of the medical decision making process for this encounter relevant to the patients independent evaluation by me today.        Patient Data  Patient History  Past Medical History:   Diagnosis Date    Anxiety     Bradycardia     Endometriosis     Migraines          Past Surgical History:   Procedure Laterality Date    ABDOMINAL SURGERY      HX BREAST AUGMENTATION      HX DILATION AND CURETTAGE           Family Medical History:    None         Social History     Tobacco Use    Smoking status: Never    Smokeless tobacco: Never   Vaping Use    Vaping Use: Never used   Substance Use Topics    Alcohol use: Not Currently    Drug use: Not Currently        The above documented section regarding past medical, past surgical, family, and social history (PMFSH) has been reviewed and considered and to the best of my knowledge represents a valid and accurate reflection of the patient's previous pertinent experiences documented by multiple providers and participants of the EMR.I cannot attest to all entries but do no recognize any gross inaccuracies as the data is a common field  across all providers  Further history pertinent to the current encounter will be found as referenced       Physical Examination:    Vitals:    06/21/22 1556   BP: 106/72   Pulse: 81   Temp: 36.1 C (97 F)   SpO2: 97%   Weight: 68.9 kg (152 lb)   Height: 1.676 m (5\' 6" )   BMI: 24.58        General:appropriate for age. in no acute distress.  HEENT:Atraumatic, Normocephalic.   Lungs:Nonlabored breathing with symmetric expansion  Heart:Regular wth respect to rate and rythmn.    Abdomen:Soft. Nontender. Nondistended  Bowel sounds are present. No peritoneal signs  Skin:No Rashes. No ulcers. Skin warm  Psychiatric:Alert and oriented to person, place, and time. affect appropriate            Diagnosis:  ENCOUNTER DIAGNOSES     ICD-10-CM   1. Rectal bleeding  K62.5              Plan:  I had a long discussion with [name] regarding her findings.  Discussed options to include observation versus consideration of endoscopy with possible hemorrhoidal banding as needed.  Risks and benefits of each were discussed.  She  would like to hold on endoscopy for now.  She will call with a change in her decision.  If symptoms recur we will pursue endoscopy    This note may have been partially generated using MModal Fluency Direct system, and there may be some incorrect words, spellings, and punctuation that were not noted in checking the note before saving, though effort was made to avoid such errors.      Reinaldo Meeker MD FACS RVT  [facility]

## 2022-12-13 IMAGING — MR MRI CERVICAL SPINE WITHOUT CONTRAST
5 of 9 series · 23 of 48 positions shown · non-contrast
Comparison: None available.

﻿EXAM:  [DATE]   MRI ORBIT/FACE/NECK WITHOUT CONTRAST,MRI BRAIN W/O CONTRAST
INDICATION: Neck pain with neck and facial numbness.
TECHNIQUE: Noncontrast multiplanar, multisequence MRI was performed.

[Series 5: T2 · sagittal · 3.0mm · 0.75mm/px · 3 of 13 slices shown (1 of 3)]
[im 1/13]
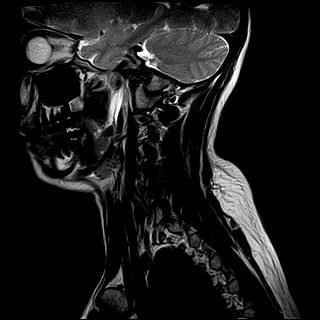
[im 7/13]
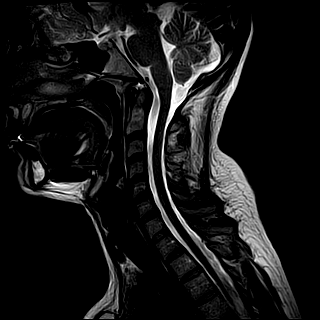
[im 13/13]
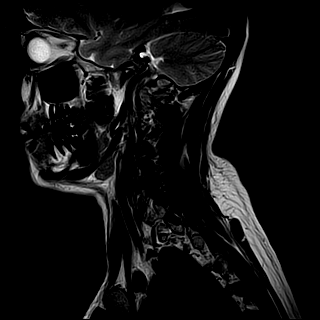

[Series 6: T1 · sagittal · 3.0mm · 0.47mm/px · 3 of 13 slices shown (1 of 2)]
[im 1/13]
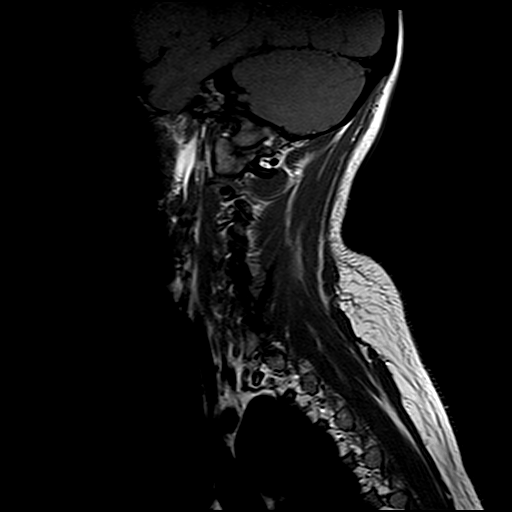
[im 7/13]
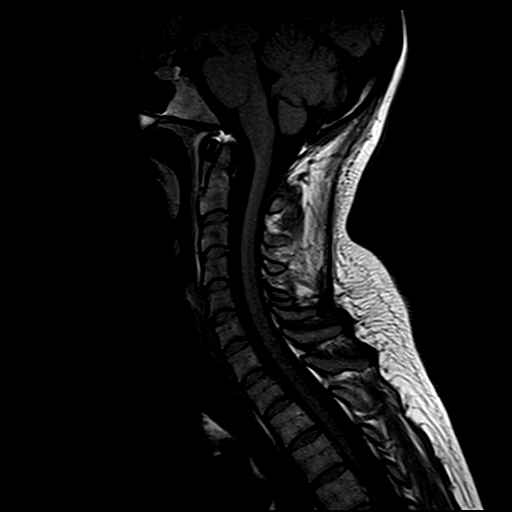
[im 13/13]
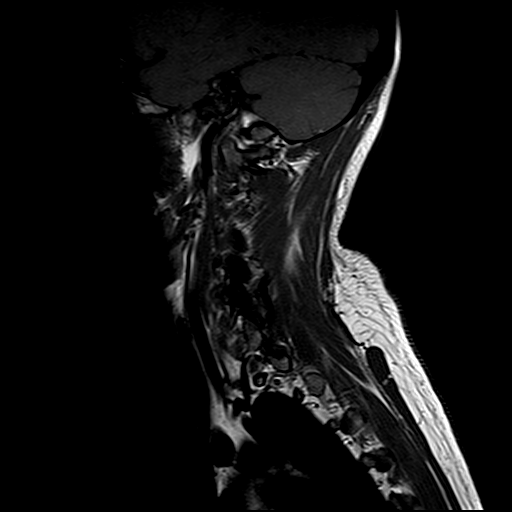

[Series 11: T2 · axial · 5.0mm · 0.47mm/px · z∈[-66,+108]mm · 8 of 30 slices shown (2 of 3)]
[im 1/30]
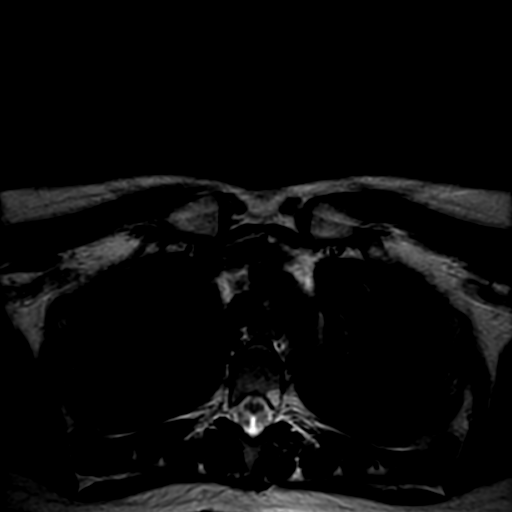
[im 5/30]
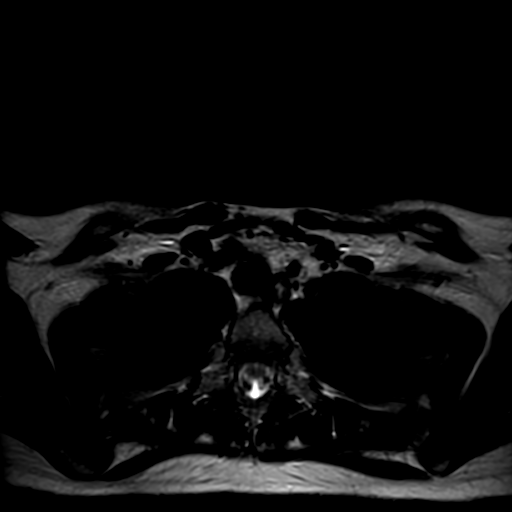
[im 9/30]
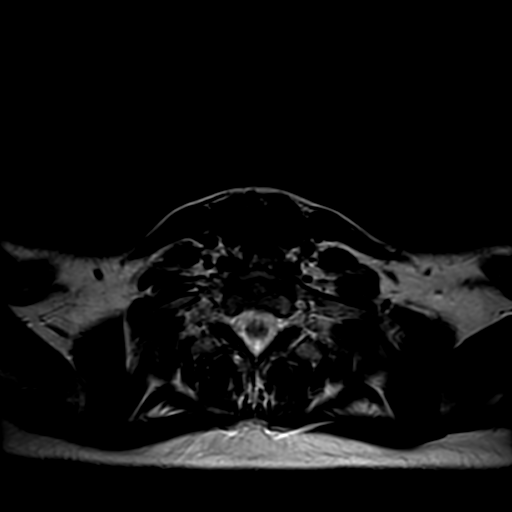
[im 13/30]
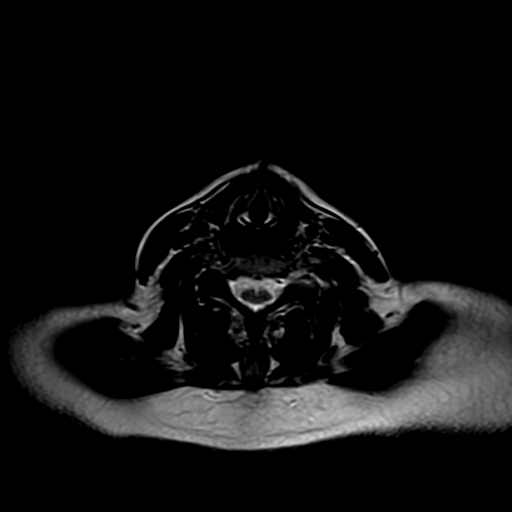
[im 17/30]
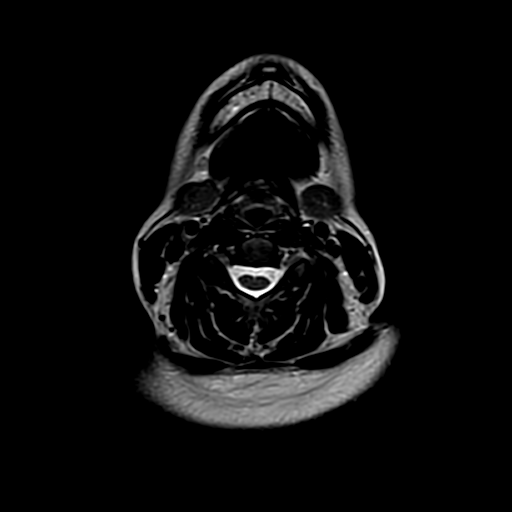
[im 21/30]
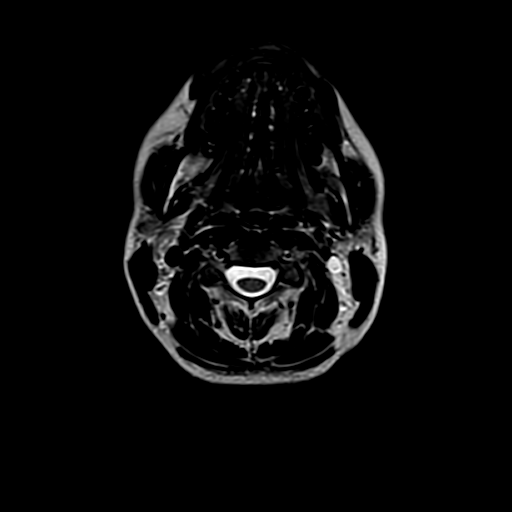
[im 25/30]
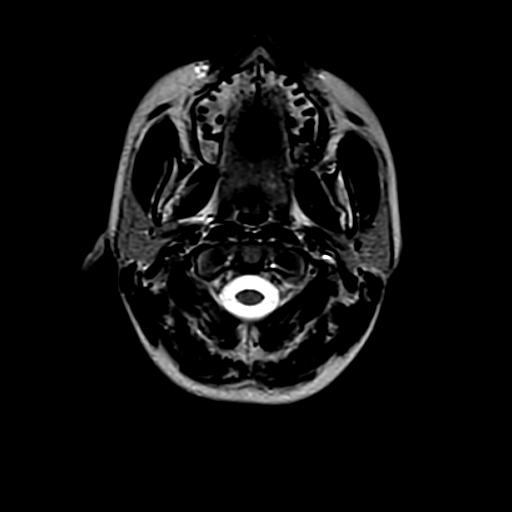
[im 30/30]
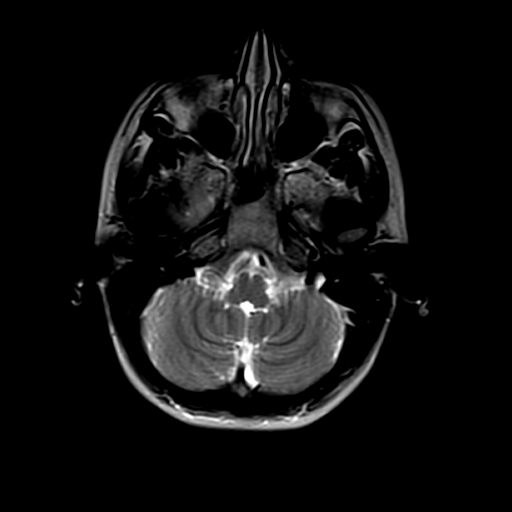

[Series 12: T1 · axial · 5.0mm · 0.62mm/px · z∈[-66,+6]mm · 4 of 30 slices shown (2 of 2)]
[im 1/30]
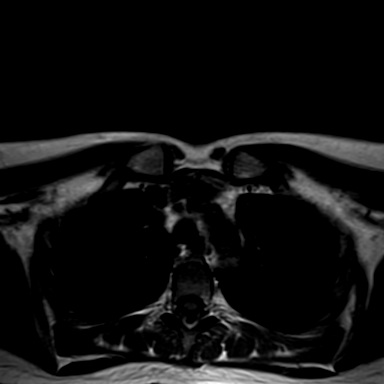
[im 5/30]
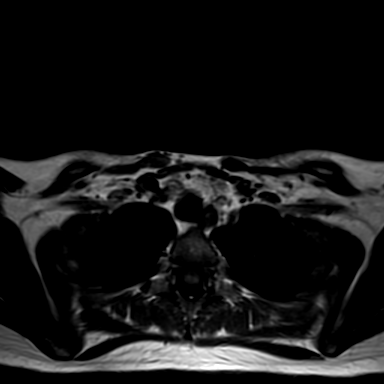
[im 9/30]
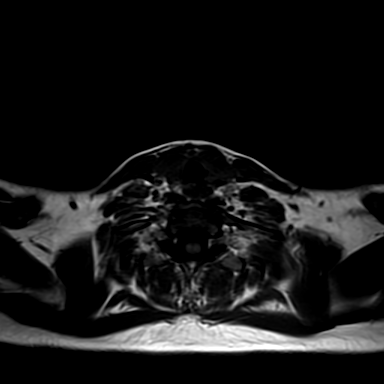
[im 13/30]
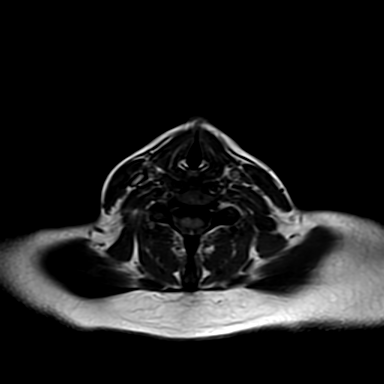

[Series 16: T2 · axial · 3.0mm · 0.39mm/px · z∈[-66,+40]mm · 5 of 18 slices shown (3 of 3)]
[im 1/18]
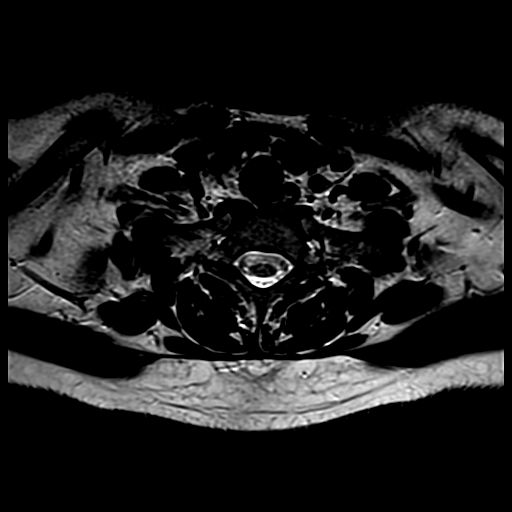
[im 5/18]
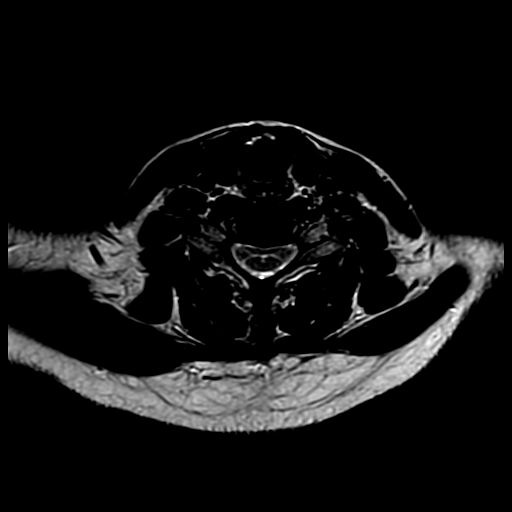
[im 9/18]
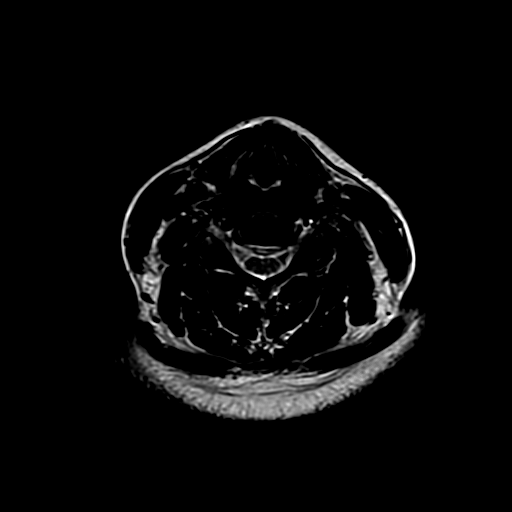
[im 13/18]
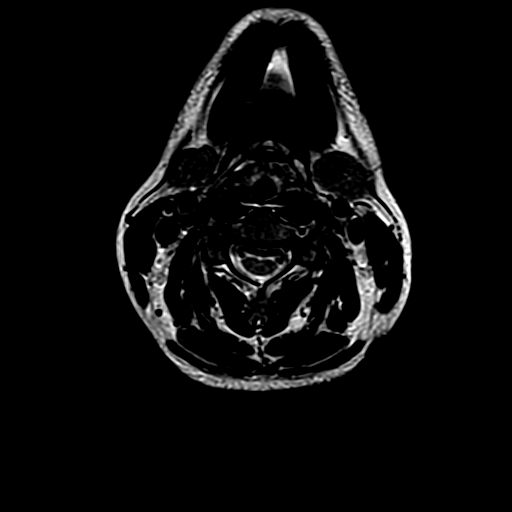
[im 18/18]
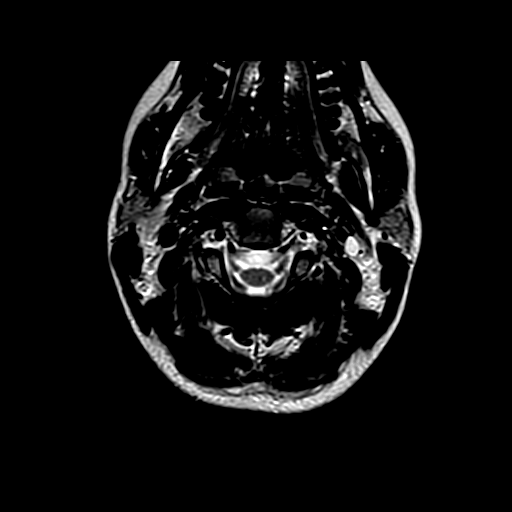

[23 of 48 positions shown; findings below may reference images not displayed]

FINDINGS: There are multiple small foci of high signal intensity within the cerebral white matter bilaterally.  The location and appearance are nonspecific, but the patient’s relatively young age of 36 years old raises the possibility of early or mild multiple sclerosis.  

There is no involvement of the corpus callosum, brainstem, cerebellum, or cervical spinal cord.  The orbits appear unremarkable.  There is no evidence of optic neuritis.  

This examination is otherwise unremarkable.  There is no evidence of mass, hemorrhage, shift of the midline structures, or extra-axial fluid collection.  The ventricles and CSF spaces appear within normal limits.  

The paranasal sinuses and mastoid air cells appear clear.  

The cervical spine demonstrates no fracture, malalignment, significant degenerative change, marrow signal alteration, disc herniation, or spinal stenosis.
IMPRESSION: Possible multiple sclerosis.

## 2022-12-13 IMAGING — MR MRI BRAIN W/O CONTRAST
10 series · 48 of 48 positions shown · non-contrast
Comparison: None available.

﻿EXAM:  [DATE]   MRI ORBIT/FACE/NECK WITHOUT CONTRAST,MRI BRAIN W/O CONTRAST
INDICATION: Neck pain with neck and facial numbness.
TECHNIQUE: Noncontrast multiplanar, multisequence MRI was performed.

[Series 5: DWI · axial · 5.0mm · 1.35mm/px · z∈[-55,+71]mm · 14 of 88 slices shown (1 of 3)]
[im 1/88]
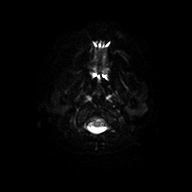
[im 7/88]
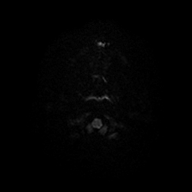
[im 14/88]
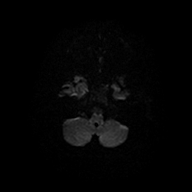
[im 21/88]
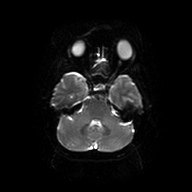
[im 27/88]
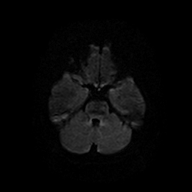
[im 34/88]
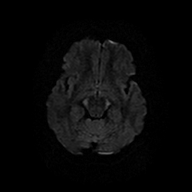
[im 41/88]
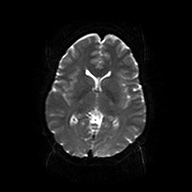
[im 47/88]
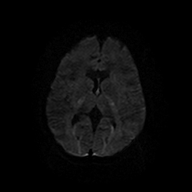
[im 54/88]
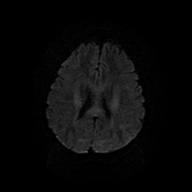
[im 61/88]
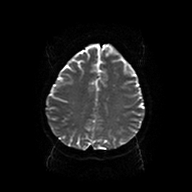
[im 67/88]
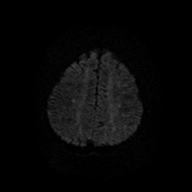
[im 74/88]
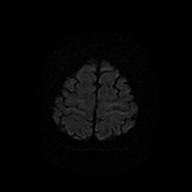
[im 81/88]
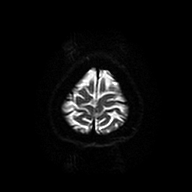
[im 88/88]
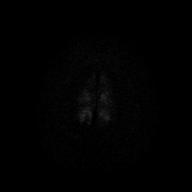

[Series 6: DWI · axial · 5.0mm · 1.35mm/px · z∈[-55,+71]mm · 3 of 22 slices shown (2 of 3)]
[im 1/22]
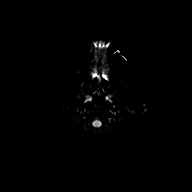
[im 11/22]
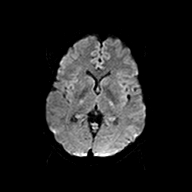
[im 22/22]
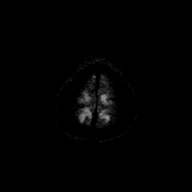

[Series 7: DWI · axial · 5.0mm · 1.35mm/px · z∈[-55,+71]mm · 4 of 22 slices shown (3 of 3)]
[im 1/22]
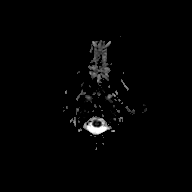
[im 8/22]
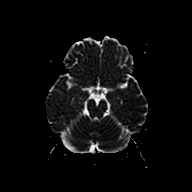
[im 15/22]
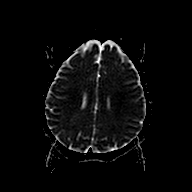
[im 22/22]
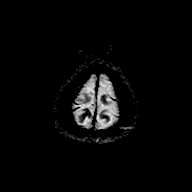

[Series 8: FLAIR · sagittal · 4.0mm · 0.75mm/px · 4 of 26 slices shown (1 of 2)]
[im 1/26]
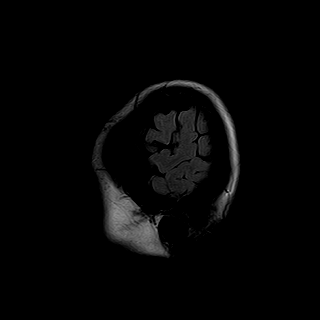
[im 9/26]
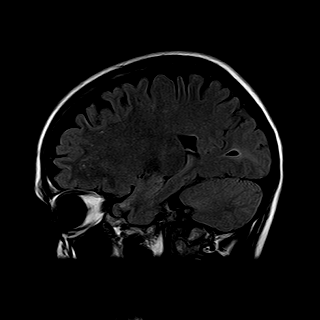
[im 17/26]
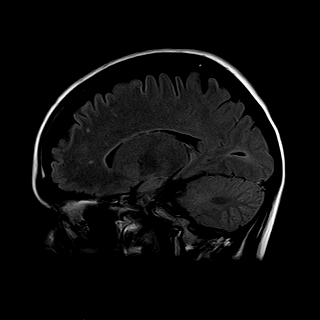
[im 26/26]
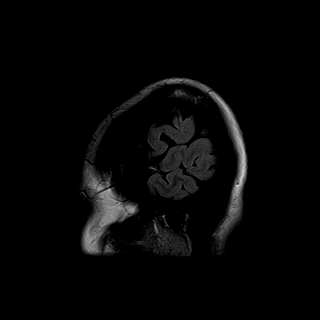

[Series 9: T2 · axial · 5.0mm · 0.43mm/px · z∈[-69,+75]mm · 4 of 25 slices shown (1 of 2)]
[im 1/25]
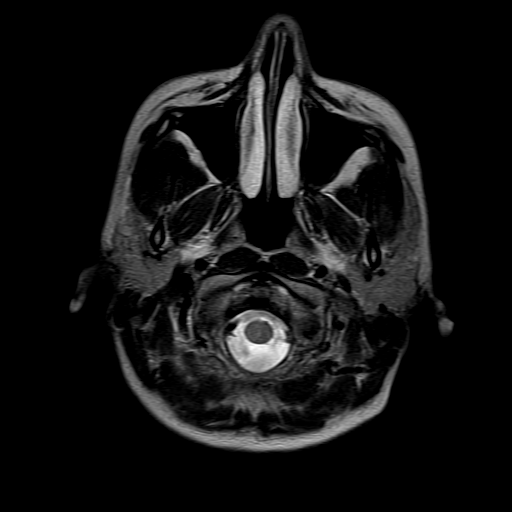
[im 9/25]
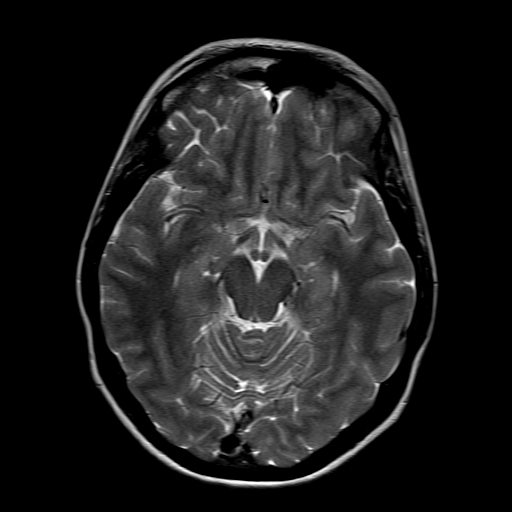
[im 17/25]
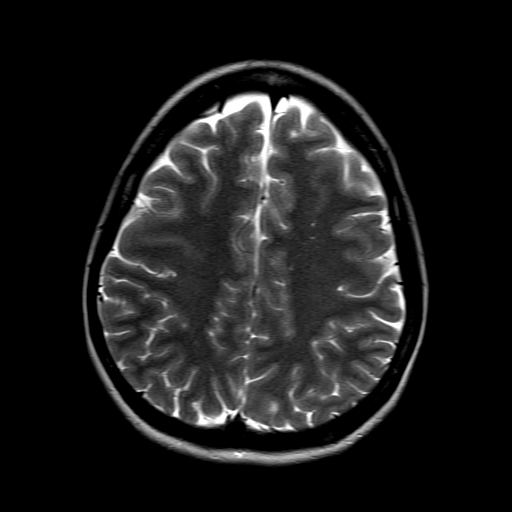
[im 25/25]
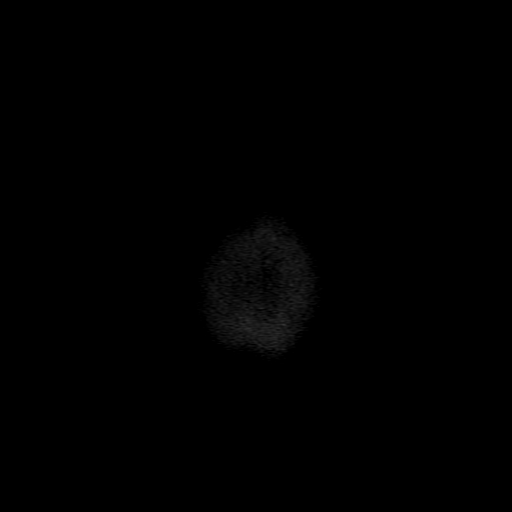

[Series 10: FLAIR · axial · 5.0mm · 0.76mm/px · z∈[-60,+66]mm · 4 of 22 slices shown (2 of 2)]
[im 1/22]
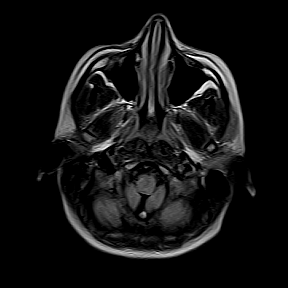
[im 8/22]
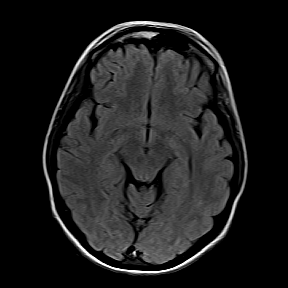
[im 15/22]
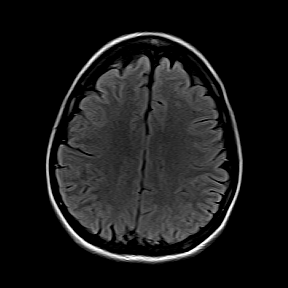
[im 22/22]
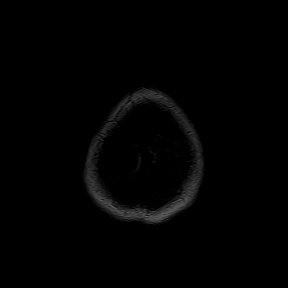

[Series 11: T1 · axial · 5.0mm · 0.69mm/px · z∈[-60,+66]mm · 4 of 22 slices shown (1 of 2)]
[im 1/22]
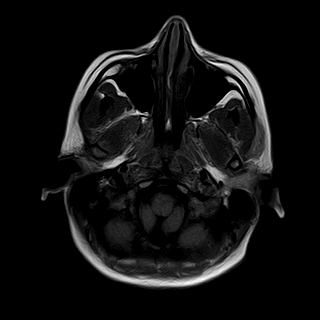
[im 8/22]
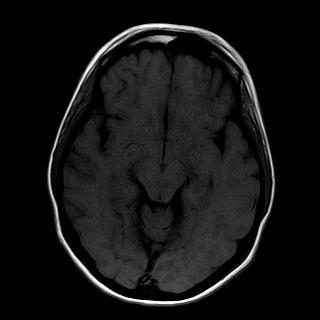
[im 15/22]
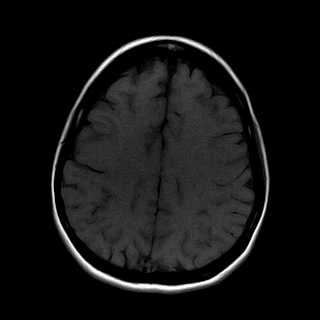
[im 22/22]
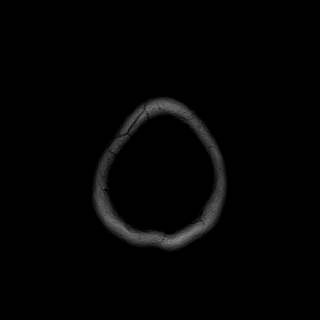

[Series 12: T2-star · axial · 5.0mm · 0.69mm/px · z∈[-60,+66]mm · 4 of 22 slices shown]
[im 1/22]
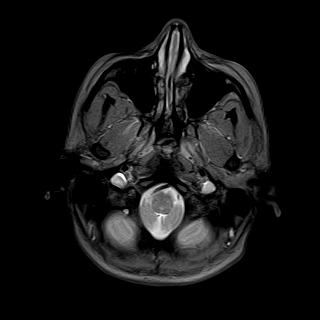
[im 8/22]
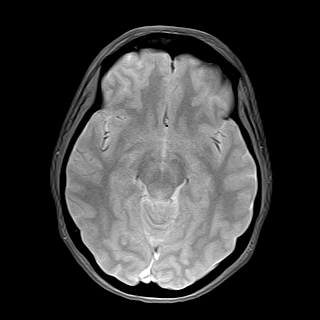
[im 15/22]
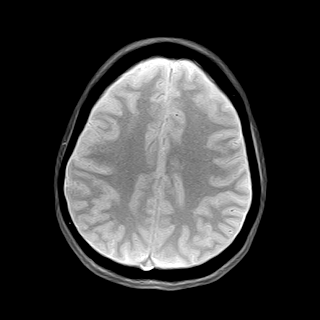
[im 22/22]
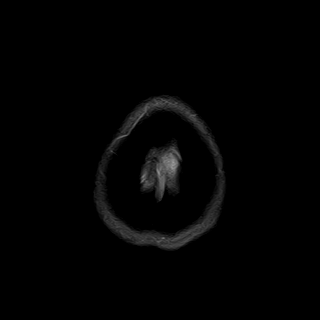

[Series 13: T2 · coronal · 6.5mm · 0.43mm/px · 4 of 24 slices shown (2 of 2)]
[im 1/24]
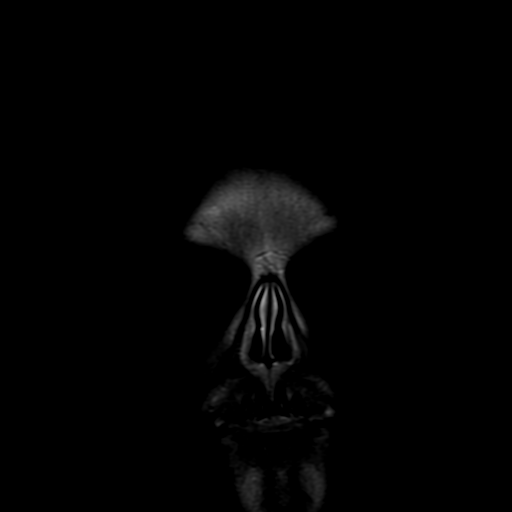
[im 8/24]
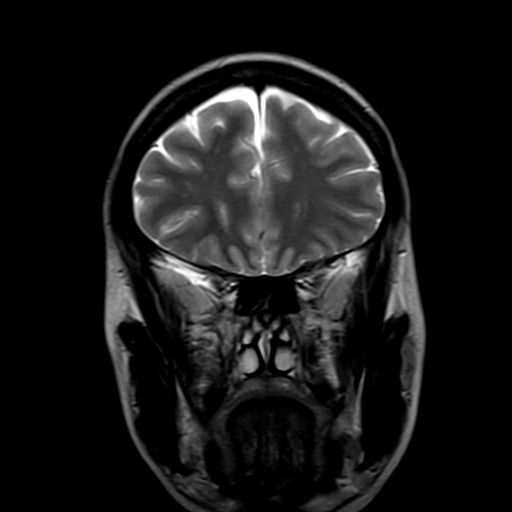
[im 16/24]
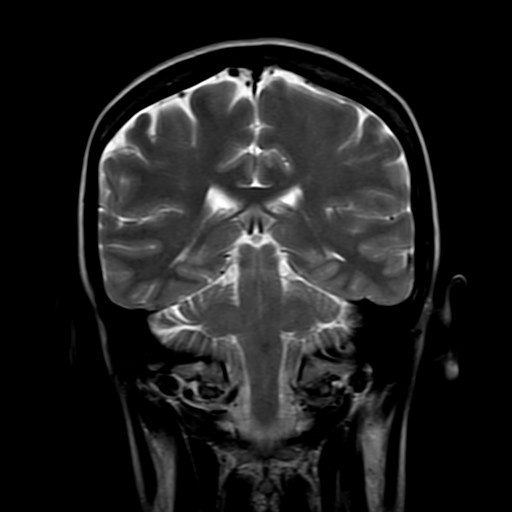
[im 24/24]
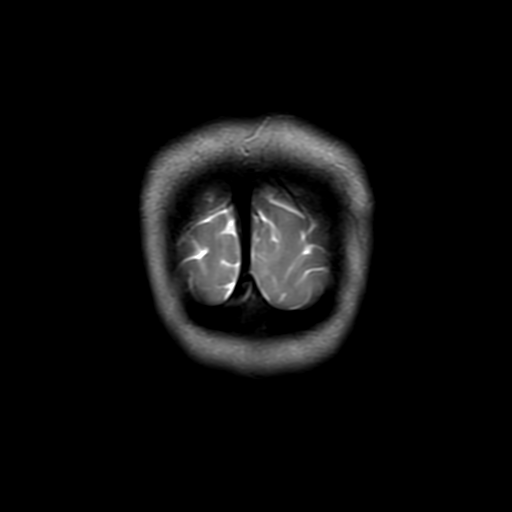

[Series 14: T1 · axial · 3.0mm · 0.70mm/px · z∈[-79,-20]mm · 3 of 18 slices shown (2 of 2)]
[im 1/18]
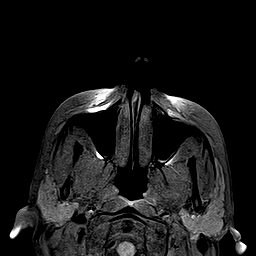
[im 9/18]
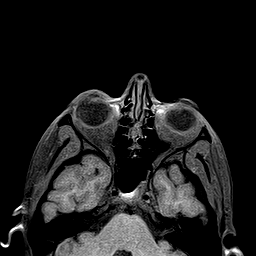
[im 18/18]
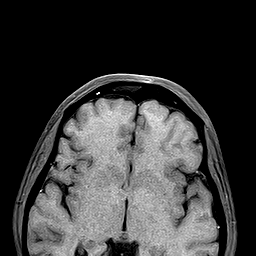

[48 of 48 positions shown; findings below may reference images not displayed]

FINDINGS: There are multiple small foci of high signal intensity within the cerebral white matter bilaterally.  The location and appearance are nonspecific, but the patient’s relatively young age of 36 years old raises the possibility of early or mild multiple sclerosis.  

There is no involvement of the corpus callosum, brainstem, cerebellum, or cervical spinal cord.  The orbits appear unremarkable.  There is no evidence of optic neuritis.  

This examination is otherwise unremarkable.  There is no evidence of mass, hemorrhage, shift of the midline structures, or extra-axial fluid collection.  The ventricles and CSF spaces appear within normal limits.  

The paranasal sinuses and mastoid air cells appear clear.  

The cervical spine demonstrates no fracture, malalignment, significant degenerative change, marrow signal alteration, disc herniation, or spinal stenosis.
IMPRESSION: Possible multiple sclerosis.

## 2023-08-06 ENCOUNTER — Other Ambulatory Visit (HOSPITAL_COMMUNITY): Payer: Self-pay

## 2023-08-06 DIAGNOSIS — Z1239 Encounter for other screening for malignant neoplasm of breast: Secondary | ICD-10-CM

## 2023-08-22 ENCOUNTER — Encounter (HOSPITAL_COMMUNITY): Payer: Self-pay

## 2023-08-22 ENCOUNTER — Other Ambulatory Visit (HOSPITAL_COMMUNITY): Payer: Self-pay

## 2023-08-22 ENCOUNTER — Other Ambulatory Visit: Payer: Self-pay

## 2023-08-22 ENCOUNTER — Ambulatory Visit
Admission: RE | Admit: 2023-08-22 | Discharge: 2023-08-22 | Disposition: A | Discharge status: Home / Self Care | Payer: BC Managed Care – PPO | Source: Ambulatory Visit

## 2023-08-22 DIAGNOSIS — Z1239 Encounter for other screening for malignant neoplasm of breast: Secondary | ICD-10-CM

## 2023-08-22 DIAGNOSIS — Z1231 Encounter for screening mammogram for malignant neoplasm of breast: Secondary | ICD-10-CM | POA: Insufficient documentation

## 2024-06-25 ENCOUNTER — Other Ambulatory Visit: Payer: Self-pay

## 2024-06-25 ENCOUNTER — Encounter (HOSPITAL_COMMUNITY): Payer: Self-pay

## 2024-06-25 ENCOUNTER — Emergency Department
Admission: EM | Admit: 2024-06-25 | Discharge: 2024-06-25 | Disposition: A | Discharge status: Home / Self Care | Payer: MEDICAID | Attending: Emergency Medicine | Admitting: Emergency Medicine

## 2024-06-25 ENCOUNTER — Emergency Department (HOSPITAL_COMMUNITY): Payer: MEDICAID

## 2024-06-25 DIAGNOSIS — I498 Other specified cardiac arrhythmias: Secondary | ICD-10-CM | POA: Insufficient documentation

## 2024-06-25 DIAGNOSIS — N83209 Unspecified ovarian cyst, unspecified side: Secondary | ICD-10-CM

## 2024-06-25 DIAGNOSIS — R102 Pelvic and perineal pain unspecified side: Secondary | ICD-10-CM | POA: Insufficient documentation

## 2024-06-25 DIAGNOSIS — N83202 Unspecified ovarian cyst, left side: Secondary | ICD-10-CM

## 2024-06-25 DIAGNOSIS — Z9889 Other specified postprocedural states: Secondary | ICD-10-CM | POA: Insufficient documentation

## 2024-06-25 LAB — COMPREHENSIVE METABOLIC PANEL, NON-FASTING
ALBUMIN/GLOBULIN RATIO: 1.8 — ABNORMAL HIGH (ref 0.8–1.4)
ALBUMIN: 4.9 g/dL (ref 3.5–5.7)
ALKALINE PHOSPHATASE: 31 U/L — ABNORMAL LOW (ref 34–104)
ALT (SGPT): 13 U/L (ref 7–52)
ANION GAP: 8 mmol/L (ref 4–13)
AST (SGOT): 18 U/L (ref 13–39)
BILIRUBIN TOTAL: 0.7 mg/dL (ref 0.3–1.0)
BUN/CREA RATIO: 12 (ref 6–22)
BUN: 10 mg/dL (ref 7–25)
CALCIUM, CORRECTED: 9 mg/dL (ref 8.9–10.8)
CALCIUM: 9.7 mg/dL (ref 8.6–10.3)
CHLORIDE: 106 mmol/L (ref 98–107)
CO2 TOTAL: 26 mmol/L (ref 21–31)
CREATININE: 0.83 mg/dL (ref 0.60–1.30)
ESTIMATED GFR: 93 mL/min/1.73mˆ2 (ref >59)
GLOBULIN: 2.7 (ref 2.0–3.5)
GLUCOSE: 92 mg/dL (ref 74–109)
OSMOLALITY, CALCULATED: 278 mosm/kg (ref 270–290)
POTASSIUM: 3.5 mmol/L (ref 3.5–5.1)
PROTEIN TOTAL: 7.6 g/dL (ref 6.4–8.9)
SODIUM: 140 mmol/L (ref 136–145)

## 2024-06-25 LAB — CBC WITH DIFF
BASOPHIL #: 0 x10ˆ3/uL (ref 0.00–0.10)
BASOPHIL %: 1 % (ref 0–1)
EOSINOPHIL #: 0.1 x10ˆ3/uL (ref 0.00–0.50)
EOSINOPHIL %: 2 % (ref 1–7)
HCT: 42.2 % — ABNORMAL HIGH (ref 31.2–41.9)
HGB: 14.7 g/dL — ABNORMAL HIGH (ref 10.9–14.3)
LYMPHOCYTE #: 2.2 x10ˆ3/uL (ref 1.10–3.10)
LYMPHOCYTE %: 33 % (ref 16–46)
MCH: 31 pg (ref 24.7–32.8)
MCHC: 34.7 g/dL (ref 32.3–35.6)
MCV: 89.3 fL (ref 75.5–95.3)
MONOCYTE #: 0.4 x10ˆ3/uL (ref 0.20–0.90)
MONOCYTE %: 6 % (ref 4–11)
MPV: 8.8 fL (ref 7.9–10.8)
NEUTROPHIL #: 3.8 x10ˆ3/uL (ref 1.90–8.20)
NEUTROPHIL %: 58 % (ref 43–77)
PLATELETS: 223 x10ˆ3/uL (ref 140–440)
RBC: 4.73 x10ˆ6/uL (ref 3.63–4.92)
RDW: 12.8 % (ref 12.3–17.7)
WBC: 6.5 x10ˆ3/uL (ref 3.8–11.8)

## 2024-06-25 LAB — PT/INR
INR: 1 (ref 0.84–1.10)
PROTHROMBIN TIME: 11.3 s (ref 9.8–12.7)

## 2024-06-25 LAB — ECG 12 LEAD
Calculated T Axis: 80 degrees
QRS Duration: 102 ms
QT Interval: 388 ms
QTC Calculation: 415 ms
Ventricular rate: 69 {beats}/min

## 2024-06-25 NOTE — ED Nurses Note (Signed)
 Patient discharged home with family.  AVS reviewed with patient/care giver.  A written copy of the AVS and discharge instructions was given to the patient/care giver.  Questions sufficiently answered as needed.  Patient/care giver encouraged to follow up with PCP as indicated.  In the event of an emergency, patient/care giver instructed to call 911 or go to the nearest emergency room.   Patient did not have primary assessment completed due to not being assigned a primary nurse while in the ER today.

## 2024-06-25 NOTE — ED Triage Notes (Signed)
 Pt reports she works at [employer] office, reports that she has a large cyst on her ovary and was sent over for evaluation. Pt reports that the cyst has doubled in size over the past two weeks. Pt states the left side of her lower abdomen is tender and Dr. Celestia is concerned for ovarian torsion. Pt sent over to register and be seen, then planned for surgery hopefully.

## 2024-06-25 NOTE — Discharge Instructions (Signed)
 Thank you for allowing us  to be part of your care.    Call Dr. Celestia for follow-up.    Please discuss all medications with your pharmacist to ensure there are no concerns of interactions.    Please ensure all questions or concerns are addressed prior to leaving the hospital. We want to make sure your concerns are addressed to make sure you are as safe and healthy as possible. By leaving the hospital, it is understood you are in agreement with your treatment plan.    You may have received sedating medication during your visit. Please discuss this with your discharging provider nurse as you may not be able to operate machines while the medication is in your system, or while you are taking any potentially sedating prescriptions.    Please call the hospital medical records office for a copy of your finalized results, and review them with a primary care physician, for any findings needing further attention.    If you feel your situation worsens, or does not get better in 48 hours, please see a physician for evaluation.    We encourage you to see your regular doctor as soon as possible to let them know you were seen in the emergency department. They may want to do further testing. If you do not have a doctor, please feel free to call the hospital, and ask for contact information of accepting providers. Please also discuss your vaccinations, and ensure all are up to date.    You may use this document to take today off work or school.

## 2024-06-25 NOTE — ED Provider Notes (Signed)
 [facility]  ED Primary Provider Note  Patient Name: Nicole Gaines  Patient Age: 38 y.o.  Date of Birth: 12-26-1985    Chief Complaint: Pelvic Pain        History of Present Illness       Nicole Gaines is a 38 y.o. female who had concerns including Pelvic Pain.     History of Present Illness  Nicole Gaines is a 38 year old female who presents with left-sided abdominal pain and concern for ovarian torsion. She was referred by Dr. Celestia to ensure there was no need for surgical intervention before he left town.    She has been experiencing persistent left-sided abdominal pain throughout the day. The pain is described as not unbearable. She was unable to detect blood flow on her ovary using an ultrasound probe at work.    No burning sensation during urination or presence of blood in her urine. There is no chance of pregnancy, and she does not use an IUD or any form of birth control.    Her surgical history includes breast augmentation. She has had three pregnancies, with her youngest child being almost 54 years old.    Her current medications include Procto and Crestor.            Review of Systems     No other overt Review of Systems are noted to be positive except noted in the HPI.      Historical Data   History Reviewed This Encounter: Medical History  Surgical History  Family History  Social History      Physical Exam   ED Triage Vitals [06/25/24 1159]   BP (Non-Invasive) (!) 149/91   Heart Rate 70   Respiratory Rate 18   Temperature 37.4 C (99.3 F)   SpO2 97 %   Weight 73.9 kg (162 lb 14.4 oz)   Height 1.676 m (5' 6)         Nursing notes reviewed for what could be assessed. Past Medical, Surgical, and Social history reviewed for what has been completed.    Constitutional: NAD. Well-Developed. Well Nourished.  Head: Normocephalic, atraumatic.  Mouth/Throat:  Wearing a mask.  Eyes: EOM grossly intact, conjunctiva normal.  Neck: Supple  Cardiovascular:  Non tachycardic  Rate and Rhythm, extremities well perfused.  Pulmonary/Chest: No respiratory distress.   Abdominal: Soft, non-tender, non-distended. Non peritoneal, no rebound, no guarding.  MSK: No Lower Extremity Edema.  Skin: Warm, dry, and intact  Neuro: Appropriate, CN II-XII grossly intact.   Psych: Pleasant          Physical Exam          Procedures      Patient Data     Labs Ordered/Reviewed   COMPREHENSIVE METABOLIC PANEL, NON-FASTING - Abnormal; Notable for the following components:       Result Value    ALKALINE PHOSPHATASE 31 (*)     ALBUMIN/GLOBULIN RATIO 1.8 (*)     All other components within normal limits    Narrative:     Estimated Glomerular Filtration Rate (eGFR) is calculated using the CKD-EPI (2021) equation, intended for patients 42 years of age and older. If gender is not documented or unknown, there will be no eGFR calculation.     CBC WITH DIFF - Abnormal; Notable for the following components:    HGB 14.7 (*)     HCT 42.2 (*)     All other components within normal limits  PT/INR - Normal    Narrative:     In the setting of warfarin therapy, a moderate-intensity INR goal range is 2.0 to 3.0 and a high-intensity INR goal range is 2.5 to 3.5.    INR is ONLY validated to determine the level of anticoagulation with vitamin K antagonists (warfarin). Other factors may elevate the INR including but not limited to direct oral anticoagulants (DOACs), liver dysfunction, vitamin K deficiency, DIC, factor deficiencies, and factor inhibitors.   CBC/DIFF    Narrative:     The following orders were created for panel order CBC/DIFF.  Procedure                               Abnormality         Status                     ---------                               -----------         ------                     CBC WITH IPQQ[[id number]]                Abnormal            Final result                 Please view results for these tests on the individual orders.       US  PELVIS   Final Result by Edi, Radresults In (12/18 1335)       Large left ovarian benign hemorrhagic cyst, technically not requiring follow-up in premenopausal age. Optional follow-up for resolution in a few menstrual cycles.      No evidence of ovarian torsion at this time. Patient however is at risk due to large size of the cyst.         Radiologist location ID: [facility]             Medical Decision Making          Medical Decision Making      Results          Studies Assessed:  Lab, EKG, radiology    EKG:   This independent EKG interpretation by that has noted below, this will be reviewed and documented separately by a cardiologist in the EMR.:    Rate:  69 beats per minute    Interpretation:  PR 132, No consistent ST Elevation, No Acute STEMI Identified.      MDM Narrative:  Medical Decision Making  38 year old female with a history of breast augmentation presented with left-sided abdominal pain present throughout the day. She denied dysuria, hematuria, and pregnancy. Physical examination focused on the abdomen, and surgical history was reviewed.    Differential diagnosis includes, but is not limited to:  - Ovarian cyst: Ovarian cyst is considered due to left-sided abdominal pain and the need to evaluate for cystic structures on ultrasound.  - Ovarian torsion: Ovarian torsion is considered given the concern for absent blood flow on prior ultrasound and persistent pain, warranting further evaluation.  - Urinary tract infection: Urinary tract infection is less likely due to the absence of dysuria and hematuria.    Left-sided abdominal pain: ovarian cyst versus ovarian torsion  -  Order ultrasound to assess for ovarian torsion or cyst        Follow-Up Discussion:  The patient is updated.  She can follow up outpatient with the OBGYN.  This was discussed with Dr. Celestia as well    Please see documentation above for specific labs and radiology.    Consent for AI Scribe software [employer] discussed/obtained verbally for encounter                   ED Course as of 06/25/24  2353   Thu Jun 25, 2024   1337 Discussed with Dr. Celestia.  The patient can follow up after the holiday season for evaluation.                    Following the history, physical exam, and ED workup, the patient was deemed stable and suitable for discharge. The patient/caregiver was advised to return to the ED for any new or worsening symptoms. Discharge medications, and follow-up instructions were discussed with the patient/caregiver in detail, who verbalizes understanding. The patient/caregiver is in agreement and is comfortable with the plan of care.    Disposition: Discharged         Current Discharge Medication List        CONTINUE these medications - NO CHANGES were made during your visit.        Details   desvenlafaxine succinate 50 mg Tablet Sustained Release 24 hr  Commonly known as: PRISTIQ   50 mg, Oral, Daily  Refills: 0     DULoxetine 30 mg Capsule, Delayed Release(E.C.)  Commonly known as: CYMBALTA DR   30 mg, Daily  Refills: 0     Ibuprofen 800 mg Tablet  Commonly known as: MOTRIN   800 mg, Oral, EVERY 8 HOURS  Refills: 0     LORazepam 0.5 mg Tablet  Commonly known as: ATIVAN   0.5 mg, 2 TIMES DAILY  Refills: 0     Ozempic 0.25 mg or 0.5 mg(2 mg/1.5 mL) Pen Injector  Generic drug: semaglutide   0.25 mg, Subcutaneous, EVERY 7 DAYS  Refills: 0     rosuvastatin 10 mg Tablet  Commonly known as: CRESTOR   10 mg, Oral, Daily  Refills: 0            Follow up:   No follow-up provider specified.             Risk as noted in the medical decision-making is in reference to potential morbidity/mortality of management based upon previously established billing guidelines.    Based upon the clinical setting, the likely diagnosis/impression include:    Clinical Impression   Cyst of ovary, unspecified laterality (Primary)         Discharge Medication List as of 06/25/2024  1:51 PM                This note was partially created using voice recognition software and is inherently subject to errors including those of  syntax and sound alike  substitutions which may escape proof reading. In such instances, original meaning may be extrapolated by contextual derivation.      /R. Elspeth Griffin, MD, LYDIA PILLING  Department of Emergency Medicine  [facility]

## 2024-06-25 NOTE — ED APP Handoff Note (Signed)
 [facility] - Emergency Department  Emergency Department  Provider in Triage Note    Name: Nicole Gaines  Age: 38 y.o.  Gender: female     Subjective:   Nicole Gaines is a 38 y.o. female who presents with complaint of Pelvic Pain  .  Pt presents at the request of Dr. Celestia.  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  It has doubled in size.  Dr. Celestia is worried that the cyst is going torse.  He was unable to see her in his office to add her to the day surgery schedule so sent her to the ER and plans to do surgery later today. She had once cup of coffee a some water but no other intake. She does report pain but states it is not severe.     Objective:   Filed Vitals:    06/25/24 1159   BP: (!) 149/91   Pulse: 70   Resp: 18   Temp: 37.4 C (99.3 F)   SpO2: 97%      Focused Physical Exam shows adult female alert upright in NAD    Assessment:  A medical screening exam was completed.  This patient is a 38 y.o. female with initial findings showing concern for large ovarian cyst.    Plan:  Please see initial orders and work-up below.  This is to be continued with full evaluation in the main Emergency Department.     No current facility-administered medications for this encounter.     Results for orders placed or performed during the hospital encounter of 06/25/24 (from the past 24 hours)   CBC/DIFF    Collection Time: 06/25/24 11:58 AM    Narrative    The following orders were created for panel order CBC/DIFF.  Procedure                               Abnormality         Status                     ---------                               -----------         ------                     CBC WITH IPQQ[[id number]]                                                                 Please view results for these tests on the individual orders.        Jon Eke, PA-C  06/25/2024, 11:56

## 2024-06-26 DIAGNOSIS — I498 Other specified cardiac arrhythmias: Secondary | ICD-10-CM

## 2024-06-26 LAB — ECG 12 LEAD
Atrial Rate: 69 {beats}/min
Calculated P Axis: 68 degrees
Calculated R Axis: 88 degrees
PR Interval: 132 ms

## 2024-08-10 ENCOUNTER — Other Ambulatory Visit (HOSPITAL_COMMUNITY): Payer: Self-pay

## 2024-08-10 DIAGNOSIS — Z1231 Encounter for screening mammogram for malignant neoplasm of breast: Secondary | ICD-10-CM

## 2024-08-11 ENCOUNTER — Encounter (HOSPITAL_COMMUNITY): Payer: Self-pay
